# Patient Record
Sex: Female | Born: 1955
Health system: Southern US, Community
[De-identification: ages and names within clinical notes are randomized; demographics above are authoritative.]

## PROBLEM LIST (undated history)

## (undated) DIAGNOSIS — Z923 Personal history of irradiation: Secondary | ICD-10-CM

## (undated) DIAGNOSIS — M1990 Unspecified osteoarthritis, unspecified site: Secondary | ICD-10-CM

## (undated) DIAGNOSIS — K219 Gastro-esophageal reflux disease without esophagitis: Secondary | ICD-10-CM

## (undated) DIAGNOSIS — I10 Essential (primary) hypertension: Secondary | ICD-10-CM

## (undated) HISTORY — DX: Gastro-esophageal reflux disease without esophagitis: K21.9

## (undated) HISTORY — PX: JOINT REPLACEMENT: SHX530

## (undated) HISTORY — DX: Unspecified osteoarthritis, unspecified site: M19.90

## (undated) HISTORY — DX: Essential (primary) hypertension: I10

## (undated) HISTORY — PX: BREAST LUMPECTOMY: SHX2

---

## 1980-01-07 HISTORY — PX: KNEE SURGERY: SHX244

## 1983-01-07 HISTORY — PX: OTHER SURGICAL HISTORY: SHX169

## 1989-01-06 HISTORY — PX: SHOULDER SURGERY: SHX246

## 1998-07-30 ENCOUNTER — Other Ambulatory Visit: Admission: RE | Admit: 1998-07-30 | Discharge: 1998-07-30 | Payer: Self-pay | Admitting: Obstetrics and Gynecology

## 1999-12-23 ENCOUNTER — Other Ambulatory Visit: Admission: RE | Admit: 1999-12-23 | Discharge: 1999-12-23 | Payer: Self-pay | Admitting: *Deleted

## 1999-12-25 ENCOUNTER — Encounter: Payer: Self-pay | Admitting: Obstetrics and Gynecology

## 1999-12-25 ENCOUNTER — Encounter: Admission: RE | Admit: 1999-12-25 | Discharge: 1999-12-25 | Payer: Self-pay | Admitting: Obstetrics and Gynecology

## 2000-12-23 ENCOUNTER — Other Ambulatory Visit: Admission: RE | Admit: 2000-12-23 | Discharge: 2000-12-23 | Payer: Self-pay | Admitting: Obstetrics and Gynecology

## 2002-11-29 ENCOUNTER — Emergency Department (HOSPITAL_COMMUNITY): Admission: EM | Admit: 2002-11-29 | Discharge: 2002-11-29 | Payer: Self-pay | Admitting: Emergency Medicine

## 2002-12-16 ENCOUNTER — Other Ambulatory Visit: Admission: RE | Admit: 2002-12-16 | Discharge: 2002-12-16 | Payer: Self-pay | Admitting: Obstetrics and Gynecology

## 2002-12-21 ENCOUNTER — Ambulatory Visit (HOSPITAL_COMMUNITY): Admission: RE | Admit: 2002-12-21 | Discharge: 2002-12-21 | Payer: Self-pay | Admitting: Internal Medicine

## 2003-11-23 ENCOUNTER — Ambulatory Visit: Payer: Self-pay | Admitting: Internal Medicine

## 2004-07-17 ENCOUNTER — Ambulatory Visit: Payer: Self-pay | Admitting: Internal Medicine

## 2007-09-02 ENCOUNTER — Ambulatory Visit: Payer: Self-pay | Admitting: Family Medicine

## 2007-09-02 DIAGNOSIS — IMO0001 Reserved for inherently not codable concepts without codable children: Secondary | ICD-10-CM | POA: Insufficient documentation

## 2007-10-14 ENCOUNTER — Ambulatory Visit: Payer: Self-pay | Admitting: Internal Medicine

## 2007-10-14 DIAGNOSIS — R21 Rash and other nonspecific skin eruption: Secondary | ICD-10-CM | POA: Insufficient documentation

## 2008-03-29 ENCOUNTER — Emergency Department (HOSPITAL_COMMUNITY): Admission: EM | Admit: 2008-03-29 | Discharge: 2008-03-29 | Payer: Self-pay | Admitting: *Deleted

## 2008-07-20 ENCOUNTER — Encounter: Admission: RE | Admit: 2008-07-20 | Discharge: 2008-07-20 | Payer: Self-pay | Admitting: Obstetrics and Gynecology

## 2011-08-04 ENCOUNTER — Ambulatory Visit (INDEPENDENT_AMBULATORY_CARE_PROVIDER_SITE_OTHER): Payer: Managed Care, Other (non HMO) | Admitting: Internal Medicine

## 2011-08-04 ENCOUNTER — Encounter: Payer: Self-pay | Admitting: Internal Medicine

## 2011-08-04 VITALS — BP 136/72 | HR 90 | Temp 98.2°F | Ht 67.0 in | Wt 192.0 lb

## 2011-08-04 DIAGNOSIS — R197 Diarrhea, unspecified: Secondary | ICD-10-CM

## 2011-08-04 DIAGNOSIS — Z0000 Encounter for general adult medical examination without abnormal findings: Secondary | ICD-10-CM | POA: Insufficient documentation

## 2011-08-04 MED ORDER — CIPROFLOXACIN HCL 500 MG PO TABS: 500.0000 mg | ORAL_TABLET | Freq: Two times a day (BID) | ORAL | Status: DC

## 2011-08-04 NOTE — Assessment & Plan Note (Addendum)
Patient likely has infectious diarrhea esp considering blood tinged stools.  Start empiric cipro.  Increase fluids, continue imodium and follow BRAT diet.  Patient advised to call office if symptoms persist or worsen.  Check stool culture, lactoferrin and C. Diff

## 2011-08-04 NOTE — Progress Notes (Signed)
  Subjective:    Patient ID: Michelle Howell, female    DOB: 11-May-1955, 56 y.o.   MRN: [id number]  Diarrhea  This is a new problem. The current episode started in the past 7 days. The problem has been gradually worsening. The stool consistency is described as blood tinged. The patient states that diarrhea awakens her from sleep. Pertinent negatives include no abdominal pain, chills, fever or vomiting. She has tried anti-motility drug for the symptoms. The treatment provided no relief.   Patient denies any unusual food intake. She did have some beef ribs on Thursday night that caused indigestion. She describes having frequent bowel movements ( 2-3 per hour. Consistency is described as loose , watery , and blood-tinged.)   She denies any history of recent antibiotic use.   Review of Systems  Constitutional: Negative for fever and chills.  Gastrointestinal: Positive for diarrhea. Negative for vomiting and abdominal pain.    Past Medical History  Diagnosis Date  . GERD (gastroesophageal reflux disease)     History   Social History  . Marital Status: Single    Spouse Name: N/A    Number of Children: N/A  . Years of Education: N/A   Occupational History  . Not on file.   Social History Main Topics  . Smoking status: Never Smoker   . Smokeless tobacco: Not on file  . Alcohol Use: Yes  . Drug Use: No  . Sexually Active: Not on file   Other Topics Concern  . Not on file   Social History Narrative  . No narrative on file    History reviewed. No pertinent past surgical history.  Family History  Problem Relation Age of Onset  . Hypertension Mother   . Hyperlipidemia Mother   . Heart disease Mother   . Heart attack Mother   . Heart disease Father   . Hypertension Father   . Heart failure Father   . Heart attack Father   . Diabetes Brother   . Cancer Brother     kidney  . Heart disease Brother     Allergies  Allergen Reactions  . Meperidine Hcl   . Penicillins      Current Outpatient Prescriptions on File Prior to Visit  Medication Sig Dispense Refill  . omeprazole (PRILOSEC) 20 MG capsule Take 20 mg by mouth daily.        BP 136/72  Pulse 90  Temp 98.2 F (36.8 C) (Oral)  Ht 5\' 7"  (1.702 m)  Wt 192 lb (87.091 kg)  BMI 30.07 kg/m2       Objective:   Physical Exam  Constitutional: She is oriented to person, place, and time. She appears well-developed and well-nourished.  Cardiovascular: Normal rate, regular rhythm and normal heart sounds.   Pulmonary/Chest: Effort normal and breath sounds normal. She has no wheezes.  Abdominal: Soft. There is no rebound and no guarding.        Increase in bowel sounds  mild diffuse tenderness  Neurological: She is alert and oriented to person, place, and time.  Skin: Skin is warm and dry.  Psychiatric: She has a normal mood and affect. Her behavior is normal.       Assessment & Plan:

## 2011-08-04 NOTE — Patient Instructions (Addendum)
Increase fluid intake Use imodium over the counter 3 times daily as needed Follow BRAT diet (Bananas, plain rice, apple sauce, dry toast) for next 24-48 hrs.  You can also have clear broth. Please call our office if your symptoms do not improve or gets worse.

## 2011-08-04 NOTE — Assessment & Plan Note (Signed)
Refer for screening colonoscopy 

## 2011-08-06 ENCOUNTER — Telehealth: Payer: Self-pay | Admitting: Internal Medicine

## 2011-08-06 ENCOUNTER — Encounter: Payer: Self-pay | Admitting: Internal Medicine

## 2011-08-06 NOTE — Telephone Encounter (Signed)
Caller: Michelle Howell/Patient; PCP: Allena Earing); CB#: [phone]; ; ; Call regarding Question About Medicine; Having lot of diarrhea and going frequently, lot of rectal pressure, rectal bleeding and pain x3 days then seen in Office on Mon 08/04/2011.  Was given diet instructions and antibiotic/Cipro.    Antibioitic was filled yesterday am 7/30  but did not take due possible side effects listed of making tendinitis or arthritis worse.  Diarrhea stopped about OV time then has had 2 BMs today 7/31   both with red blood.   Has collected specimen for lab.  Has scheduled colonoscopy for end of Sept.  Still  uncomfortable stools, pressure continues, each stool is red with fresh blood.   Triaged in Gastrointestinal Bleeding Guideline - Disposition:  See Provider Within 4 Hours due to one or more episodes of rectal bleeding and no Sx of hypovolemia. Asking for change in antibiotic that will not cause problems with her tendinitis since plays tennis regularily. PLEASE CONTACT PATIENT AT  [phone].

## 2011-08-06 NOTE — Telephone Encounter (Signed)
Please call back pt today

## 2011-08-06 NOTE — Telephone Encounter (Signed)
Change abx to azithromycin 250 mg  -  Take 2 tabs once daily for 3 days. Pt still needs to be seen within 24 -48 hrs if diarrhea and rectal bleeding getting worse.   She can also try sitting in sitz bath (epsom salt) solution

## 2011-08-06 NOTE — Telephone Encounter (Signed)
Left message for pt to call back  °

## 2011-08-07 MED ORDER — AZITHROMYCIN 250 MG PO TABS: ORAL_TABLET | ORAL | Status: AC

## 2011-08-07 NOTE — Telephone Encounter (Signed)
I spoke with Michelle Howell and relayed Dr. Olegario Messier instructions. Michelle Howell will try sitz bath tonight. Coming in after work today to drop off stool samples. States she will start abx & do the bath. If she is worse tomorrow, she will call for appt with Dr. Artist Pais. [name] to call in abx to [facility] on [address]. Thanks!

## 2011-08-07 NOTE — Telephone Encounter (Signed)
rx sent electronically. 

## 2011-08-07 NOTE — Telephone Encounter (Signed)
Left message for pt to call back  °

## 2011-08-11 ENCOUNTER — Other Ambulatory Visit: Payer: Self-pay | Admitting: Internal Medicine

## 2011-08-11 LAB — STOOL CULTURE

## 2011-09-17 ENCOUNTER — Ambulatory Visit (AMBULATORY_SURGERY_CENTER): Payer: Managed Care, Other (non HMO) | Admitting: *Deleted

## 2011-09-17 VITALS — Ht 66.5 in | Wt 189.0 lb

## 2011-09-17 DIAGNOSIS — Z1211 Encounter for screening for malignant neoplasm of colon: Secondary | ICD-10-CM

## 2011-09-17 MED ORDER — NA SULFATE-K SULFATE-MG SULF 17.5-3.13-1.6 GM/177ML PO SOLN: ORAL | Status: DC

## 2011-09-18 ENCOUNTER — Encounter: Payer: Self-pay | Admitting: Internal Medicine

## 2011-10-03 ENCOUNTER — Encounter: Payer: Self-pay | Admitting: Internal Medicine

## 2011-10-03 ENCOUNTER — Ambulatory Visit (AMBULATORY_SURGERY_CENTER): Payer: Managed Care, Other (non HMO) | Admitting: Internal Medicine

## 2011-10-03 VITALS — BP 142/95 | HR 67 | Temp 97.9°F | Resp 16 | Ht 66.5 in | Wt 189.0 lb

## 2011-10-03 DIAGNOSIS — K635 Polyp of colon: Secondary | ICD-10-CM

## 2011-10-03 DIAGNOSIS — Z1211 Encounter for screening for malignant neoplasm of colon: Secondary | ICD-10-CM

## 2011-10-03 DIAGNOSIS — K621 Rectal polyp: Secondary | ICD-10-CM

## 2011-10-03 DIAGNOSIS — D126 Benign neoplasm of colon, unspecified: Secondary | ICD-10-CM

## 2011-10-03 DIAGNOSIS — K620 Anal polyp: Secondary | ICD-10-CM

## 2011-10-03 DIAGNOSIS — K6389 Other specified diseases of intestine: Secondary | ICD-10-CM

## 2011-10-03 MED ORDER — SODIUM CHLORIDE 0.9 % IV SOLN: 500.0000 mL | INTRAVENOUS | Status: DC

## 2011-10-03 NOTE — Progress Notes (Signed)
Patient did not experience any of the following events: a burn prior to discharge; a fall within the facility; wrong site/side/patient/procedure/implant event; or a hospital transfer or hospital admission upon discharge from the facility. (G8907) Patient did not have preoperative order for IV antibiotic SSI prophylaxis. (G8918)  

## 2011-10-03 NOTE — Patient Instructions (Addendum)

## 2011-10-03 NOTE — Op Note (Signed)
[facility] [address] [place], [address]   COLONOSCOPY PROCEDURE REPORT  PATIENT: Michelle Howell, Michelle Howell  MR#: [id number] BIRTHDATE: Jun 06, 1955 , 56  yrs. old GENDER: Female ENDOSCOPIST: Beverley Fiedler, MD REFERRED MW:[id number], Bruce PROCEDURE DATE:  10/03/2011 PROCEDURE:   Colonoscopy with cold biopsy polypectomy ASA CLASS:   Class II INDICATIONS:average risk screening and first colonoscopy. MEDICATIONS: MAC sedation, administered by CRNA and propofol (Diprivan) 300mg  IV  DESCRIPTION OF PROCEDURE:   After the risks benefits and alternatives of the procedure were thoroughly explained, informed consent was obtained.  A digital rectal exam revealed several skin tags.   The LB [id number]-[id number]  endoscope was introduced through the anus and advanced to the terminal ileum which was intubated for a short distance. No adverse events experienced.   The quality of the prep was Suprep good  The instrument was then slowly withdrawn as the colon was fully examined.   COLON FINDINGS: The mucosa appeared normal in the terminal ileum. Mild melanosis was found throughout the entire examined colon.   A sessile polyp measuring 3 mm in size was found at the hepatic flexure.  A polypectomy was performed with cold forceps.  The resection was complete and the polyp tissue was completely retrieved.   Two sessile polyps ranging between 3-37mm in size were found in the rectum.  Polypectomy was performed with cold forceps. All resections were complete and all polyp tissue was completely retrieved.   Moderate diverticulosis was noted in the descending colon and sigmoid colon.   Small internal hemorrhoids were found. Retroflexed views revealed internal hemorrhoids and skin tags. The time to cecum=3 minutes 47 seconds.  Withdrawal time=13 minutes 41 seconds.  The scope was withdrawn and the procedure completed.  COMPLICATIONS: There were no complications.  ENDOSCOPIC IMPRESSION: 1.    Normal mucosa in the terminal ileum 2.   Mild melanosis was found throughout the entire examined colon 3.   Sessile polyp measuring 3 mm in size was found at the hepatic flexure; polypectomy was performed with cold forceps 4.   Two sessile polyps ranging between 3-75mm in size were found in the rectum; Polypectomy was performed with cold forceps 5.   Moderate diverticulosis was noted in the descending colon and sigmoid colon 6.   Small internal hemorrhoids with skin tags.       RECOMMENDATIONS: 1.  Await pathology results 2.  High fiber diet 3.  If the polyps removed today are proven to be adenomatous (pre-cancerous) polyps, you will need a colonoscopy in 3-5 years and this decision will be based on pathology results.  Otherwise you should continue to follow colorectal cancer screening guidelines for "routine risk" patients with a colonoscopy in 10 years.  You will receive a letter within 1-2 weeks with the results of your biopsy as well as final recommendations.  Please call my office if you have not received a letter after 3 weeks.   eSigned:  Beverley Fiedler, MD 10/03/2011 11:52 AM   cc: Lindley Magnus, MD and The Patient   PATIENT NAME:  Michelle Howell, Michelle Howell MR#: [id number]

## 2011-10-06 ENCOUNTER — Telehealth: Payer: Self-pay

## 2011-10-06 NOTE — Telephone Encounter (Signed)
Left message

## 2011-10-07 ENCOUNTER — Encounter: Payer: Self-pay | Admitting: Internal Medicine

## 2013-02-09 ENCOUNTER — Ambulatory Visit (INDEPENDENT_AMBULATORY_CARE_PROVIDER_SITE_OTHER): Payer: BC Managed Care – PPO | Admitting: Family Medicine

## 2013-02-09 ENCOUNTER — Encounter: Payer: Self-pay | Admitting: Family Medicine

## 2013-02-09 ENCOUNTER — Encounter: Payer: Self-pay | Admitting: *Deleted

## 2013-02-09 VITALS — BP 150/98 | HR 100 | Temp 99.4°F | Wt 198.0 lb

## 2013-02-09 DIAGNOSIS — J329 Chronic sinusitis, unspecified: Secondary | ICD-10-CM

## 2013-02-09 MED ORDER — HYDROCOD POLST-CHLORPHEN POLST 10-8 MG/5ML PO LQCR: 5.0000 mL | Freq: Every evening | ORAL | Status: DC | PRN

## 2013-02-09 MED ORDER — AZITHROMYCIN 250 MG PO TABS: ORAL_TABLET | ORAL | Status: DC

## 2013-02-09 NOTE — Progress Notes (Signed)
Pre visit review using our clinic review tool, if applicable. No additional management support is needed unless otherwise documented below in the visit note. 

## 2013-02-09 NOTE — Progress Notes (Signed)
Chief Complaint  Patient presents with  . Cough  . head congestion    HPI:  -started: 4 days ago -symptoms:nasal congestion, sore throat, cough, sinus pain maxillary, low grade temp -denies:fever, SOB, NVD -has tried: otc medications -sick contacts/travel/risks: denies flu exposure or Ebola risks  ROS: See pertinent positives and negatives per HPI.  Past Medical History  Diagnosis Date  . GERD (gastroesophageal reflux disease)     Past Surgical History  Procedure Laterality Date  . Shoulder surgery  1991    right  . Knee surgery  1982    left  . Dental implants  1985    Family History  Problem Relation Age of Onset  . Hypertension Mother   . Hyperlipidemia Mother   . Heart disease Mother   . Heart attack Mother   . Heart disease Father   . Hypertension Father   . Heart failure Father   . Heart attack Father   . Diabetes Brother   . Cancer Brother     kidney  . Heart disease Brother   . Colon cancer Neg Hx   . Stomach cancer Neg Hx     History   Social History  . Marital Status: Single    Spouse Name: N/A    Number of Children: N/A  . Years of Education: N/A   Social History Main Topics  . Smoking status: Never Smoker   . Smokeless tobacco: Never Used  . Alcohol Use: 8.4 oz/week    14 Cans of beer per week  . Drug Use: No  . Sexual Activity: None   Other Topics Concern  . None   Social History Narrative  . None    Current outpatient prescriptions:Cholecalciferol (VITAMIN D) 2000 UNITS tablet, Take 2,000 Units by mouth daily., Disp: , Rfl: ;  Coenzyme Q10 (CO Q 10 PO), Take by mouth daily., Disp: , Rfl: ;  Flaxseed, Linseed, (FLAXSEED OIL PO), Take by mouth daily., Disp: , Rfl: ;  ibuprofen (ADVIL,MOTRIN) 200 MG tablet, Take 200 mg by mouth as needed., Disp: , Rfl: ;  Omega 3-6-9 Fatty Acids (OMEGA-3-6-9 PO), Take by mouth daily., Disp: , Rfl:  omeprazole (PRILOSEC) 20 MG capsule, Take 20 mg by mouth daily., Disp: , Rfl: ;  PINE BARK, PYCNOGENOL,  PO, Take by mouth daily., Disp: , Rfl: ;  Red Yeast Rice Extract (RED YEAST RICE PO), Take by mouth daily., Disp: , Rfl: ;  TURMERIC PO, Take by mouth daily., Disp: , Rfl: ;  UNABLE TO FIND, Med Name: Mangosistine.  Takes 2 tablets daily, Disp: , Rfl:  azithromycin (ZITHROMAX) 250 MG tablet, 2 tabs on first day then one tab daily, Disp: 6 tablet, Rfl: 0;  chlorpheniramine-HYDROcodone (TUSSIONEX PENNKINETIC ER) 10-8 MG/5ML LQCR, Take 5 mLs by mouth at bedtime as needed for cough., Disp: 115 mL, Rfl: 0  EXAM:  Filed Vitals:   02/09/13 1337  BP: 150/98  Pulse: 100  Temp: 99.4 F (37.4 C)    Body mass index is 31.48 kg/(m^2).  GENERAL: vitals reviewed and listed above, alert, oriented, appears well hydrated and in no acute distress  HEENT: atraumatic, conjunttiva clear, no obvious abnormalities on inspection of external nose and ears, normal appearance of ear canals and TMs, clear nasal congestion, mild post oropharyngeal erythema with PND, no tonsillar edema or exudate, no sinus TTP  NECK: no obvious masses on inspection  LUNGS: clear to auscultation bilaterally, no wheezes, rales or rhonchi, good air movement  CV: HRRR, no peripheral edema  MS: moves all extremities without noticeable abnormality  PSYCH: pleasant and cooperative, no obvious depression or anxiety  ASSESSMENT AND PLAN:  Discussed the following assessment and plan:  Sinusitis - Plan: chlorpheniramine-HYDROcodone (TUSSIONEX PENNKINETIC ER) 10-8 MG/5ML LQCR, azithromycin (ZITHROMAX) 250 MG tablet  -discussed risks of meds - potential for viral versus bacterial sinusitis and option to hold off on abx for several days to see if improves -follow up if worsneing or not improving    Patient Instructions  INSTRUCTIONS FOR UPPER RESPIRATORY INFECTION:  -plenty of rest and fluids  -nasal saline wash 2-3 times daily (use prepackaged nasal saline or bottled/distilled water if making your own)   -can use sinex or afrin  nasal spray for drainage and nasal congestion - but do NOT use longer then 3-4 days  -can use tylenol or ibuprofen as directed for aches and sorethroat  -in the winter time, using a humidifier at night is helpful (please follow cleaning instructions)  -if you are taking a cough medication - use only as directed, may also try a teaspoon of honey to coat the throat and throat lozenges  -for sore throat, salt water gargles can help  -follow up if you have fevers, facial pain, tooth pain, difficulty breathing or are worsening or not getting better in 5-7 days      KIM, HANNAH R.

## 2013-02-09 NOTE — Patient Instructions (Signed)

## 2013-04-20 ENCOUNTER — Telehealth: Payer: Self-pay | Admitting: Internal Medicine

## 2013-04-20 NOTE — Telephone Encounter (Signed)
Left message on pts cell phone to go to urgent care.  Dr Leanne Chang out of the office

## 2013-04-20 NOTE — Telephone Encounter (Signed)
Pt requesting a rx for pink eye. Advised pt she needed appt. Pt has refused appt and is going out of town in the am. Pt states does not have time to come in. Ok to leave message.  [facility]/ [place]

## 2013-08-05 ENCOUNTER — Ambulatory Visit (INDEPENDENT_AMBULATORY_CARE_PROVIDER_SITE_OTHER): Payer: BC Managed Care – PPO | Admitting: Family Medicine

## 2013-08-05 ENCOUNTER — Encounter: Payer: Self-pay | Admitting: Family Medicine

## 2013-08-05 VITALS — BP 126/84 | HR 89 | Temp 98.2°F | Ht 67.0 in | Wt 180.5 lb

## 2013-08-05 DIAGNOSIS — M542 Cervicalgia: Secondary | ICD-10-CM

## 2013-08-05 DIAGNOSIS — J020 Streptococcal pharyngitis: Secondary | ICD-10-CM

## 2013-08-05 LAB — CBC WITH DIFFERENTIAL/PLATELET
Basophils Absolute: 0 10*3/uL (ref 0.0–0.1)
Basophils Relative: 0.5 % (ref 0.0–3.0)
EOS PCT: 6.2 % — AB (ref 0.0–5.0)
Eosinophils Absolute: 0.3 10*3/uL (ref 0.0–0.7)
HEMATOCRIT: 42 % (ref 36.0–46.0)
Hemoglobin: 14 g/dL (ref 12.0–15.0)
LYMPHS PCT: 36 % (ref 12.0–46.0)
Lymphs Abs: 1.9 10*3/uL (ref 0.7–4.0)
MCHC: 33.5 g/dL (ref 30.0–36.0)
MCV: 93.6 fl (ref 78.0–100.0)
MONOS PCT: 5.8 % (ref 3.0–12.0)
Monocytes Absolute: 0.3 10*3/uL (ref 0.1–1.0)
Neutro Abs: 2.8 10*3/uL (ref 1.4–7.7)
Neutrophils Relative %: 51.5 % (ref 43.0–77.0)
PLATELETS: 292 10*3/uL (ref 150.0–400.0)
RBC: 4.48 Mil/uL (ref 3.87–5.11)
RDW: 12.4 % (ref 11.5–15.5)
WBC: 5.4 10*3/uL (ref 4.0–10.5)

## 2013-08-05 LAB — POCT RAPID STREP A (OFFICE): RAPID STREP A SCREEN: NEGATIVE

## 2013-08-05 LAB — TSH: TSH: 0.58 u[IU]/mL (ref 0.35–4.50)

## 2013-08-05 NOTE — Patient Instructions (Addendum)
Needs to complete record release form for OBGYN, request recent labs.    Throat pain  Unclear etiology, suspect most likely a soft tissue injury or muscle strain  Check CBC and thyroid today  If any symptoms that we discussed not limited to headache, trouble swallowing, weakness, blurry vision, hearing difficulty, worsening pain were to occur you should seek care immediately   We discussed there was a risk of a carotid injury though we thought this was less likely.   Follow up in 1 week. Would consider imaging or ENT referral.

## 2013-08-05 NOTE — Progress Notes (Signed)
Garret Reddish, MD Phone: [phone]  Subjective:   Michelle Howell is a 58 y.o. 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 8/10 tension pain like something wanted to explode or like she was hit. Felt like an intense charlie horse lasting about 1-2 minutes. She noted some swelling and the pain and swelling and tenderness persisted over night at a lower level. Next morning pain continued at low level. Almost in a line from her ear down most of sternocleidomastoid with current pain 4/10 with pushing area and 1-2 with not touching it. The pain seems to be focused about midway down her SCM muscle and is worse with palpation.  Took ibuprofen last night and helped some. Was able to play 4 hours of tennis yesterday without difficulty.   ROS- no fever/chills. No dysphagia but more aware of food when swallowing. No slurred speech. No headaches or blurry vision. No weakness. No lightheadedness. Not worse with turning head. No history neck injury. No neck stiffness.   Past Medical History- previously healthy, nonsmoker, no diabetes history, may have elevated cholesterol Family history- no history of dissections of aorta or carotid artery.  Social history- nonsmoker  Medications- reviewed and updated Current Outpatient Prescriptions  Medication Sig Dispense Refill  . Cholecalciferol (VITAMIN D) 2000 UNITS tablet Take 2,000 Units by mouth daily.      . Coenzyme Q10 (CO Q 10 PO) Take by mouth daily.      . Flaxseed, Linseed, (FLAXSEED OIL PO) Take by mouth daily.      Marland Kitchen ibuprofen (ADVIL,MOTRIN) 200 MG tablet Take 200 mg by mouth as needed.      Ernestine Conrad 3-6-9 Fatty Acids (OMEGA-3-6-9 PO) Take by mouth daily.      Marland Kitchen omeprazole (PRILOSEC) 20 MG capsule Take 20 mg by mouth daily.      Marland Kitchen PINE BARK, PYCNOGENOL, PO Take by mouth daily.        . TURMERIC PO Take by mouth daily.      Marland Kitchen UNABLE TO FIND Med Name: Mangosistine.  Takes 2 tablets daily      . Red Yeast Rice Extract (RED YEAST RICE PO) Take by mouth daily.       No current facility-administered medications for this visit.    Objective: BP 126/84  Pulse 89  Temp(Src) 98.2 F (36.8 C)  Ht 5\' 7"  (1.702 m)  Wt 180 lb 8 oz (81.874 kg)  BMI 28.26 kg/m2  SpO2 97% Gen: NAD, resting comfortably on chair HEENT: turbinates normal, oropharynx normal without pharyngeal exudate, TM normal bilaterally, Mucous membranes are moist. Neck: no thyromegaly, about 1/2 way down sternocleidomastoid. Carotid artery palpable at this point without clear abnormality. No bruits detected.  Head:  NCAT. PERRLA. No ptosis. CV: RRR no mrg  Lungs: CTAB  Skin: warm and dry, no rash  Neuro: CN II-XII intact, sensation and reflexes normal throughout, 5/5 muscle strength in bilateral upper and lower extremities. Normal finger to nose. Normal rapid alternating movements. Normal gait. No pronator drift. Normal romberg.   Results for orders placed in visit on 08/05/13 (from the past 24 hour(s))  POCT RAPID STREP A (OFFICE)     Status: None   Collection Time    08/05/13  2:12 PM      Result Value Ref Range   Rapid Strep A Screen Negative  Negative  CBC WITH DIFFERENTIAL     Status: Abnormal   Collection Time    08/05/13  2:48 PM      Result Value Ref Range   WBC 5.4  4.0 - 10.5 K/uL   RBC 4.48  3.87 - 5.11 Mil/uL   Hemoglobin 14.0  12.0 - 15.0 g/dL   HCT 42.0  36.0 - 46.0 %   MCV 93.6  78.0 - 100.0 fl   MCHC 33.5  30.0 - 36.0 g/dL   RDW 12.4  11.5 - 15.5 %   Platelets 292.0  150.0 - 400.0 K/uL   Neutrophils Relative % 51.5  43.0 - 77.0 %   Lymphocytes Relative 36.0  12.0 - 46.0 %   Monocytes Relative 5.8  3.0 - 12.0 %   Eosinophils Relative 6.2 (*) 0.0 - 5.0 %   Basophils Relative 0.5  0.0 - 3.0 %   Neutro Abs 2.8  1.4 - 7.7 K/uL   Lymphs Abs 1.9  0.7 - 4.0 K/uL   Monocytes Absolute 0.3   0.1 - 1.0 K/uL   Eosinophils Absolute 0.3  0.0 - 0.7 K/uL   Basophils Absolute 0.0  0.0 - 0.1 K/uL  TSH     Status: None   Collection Time    08/05/13  2:48 PM      Result Value Ref Range   TSH 0.58  0.35 - 4.50 uIU/mL    Assessment/Plan:  Neck Pain Suspect soft tissue injury with unclear mechanism. Possible muscle strain though not likely of sternocleidomastoid which is nontender. We checked a TSH and CBC with diff today which were unremarkable. Normal neurological exam and no other symptoms to suggest carotid dissection such as typical features of horner's syndrome. Given patient played 4 hours tennis yesterday without any symptoms other than mild discomfort in neck, doubt there is a carotid artery dissection. Patient with no personal or family history of vessel issues and she has minimal risk factors (possible high cholesterol-ob/gyn faxing [place] records) We discussed this is a possibility and gave warning signs. Patient will follow up next week (advised early next week). We will consider referral to ENT vs. Imaging to rule out carotid pathology. Imaging considerations for future. [facility] recommends axial MRI of the neck with T1-weighted, fat-suppressed sequences and magnetic resonance angiography (MRA) .

## 2013-08-05 NOTE — Progress Notes (Signed)
Pre visit review using our clinic review tool, if applicable. No additional management support is needed unless otherwise documented below in the visit note. 

## 2013-08-12 ENCOUNTER — Ambulatory Visit (INDEPENDENT_AMBULATORY_CARE_PROVIDER_SITE_OTHER): Payer: BC Managed Care – PPO | Admitting: Family Medicine

## 2013-08-12 ENCOUNTER — Encounter: Payer: Self-pay | Admitting: Family Medicine

## 2013-08-12 VITALS — BP 140/98 | HR 84 | Temp 98.0°F | Wt 192.0 lb

## 2013-08-12 DIAGNOSIS — M19072 Primary osteoarthritis, left ankle and foot: Secondary | ICD-10-CM

## 2013-08-12 DIAGNOSIS — M542 Cervicalgia: Secondary | ICD-10-CM

## 2013-08-12 DIAGNOSIS — M19079 Primary osteoarthritis, unspecified ankle and foot: Secondary | ICD-10-CM

## 2013-08-12 DIAGNOSIS — E785 Hyperlipidemia, unspecified: Secondary | ICD-10-CM

## 2013-08-12 MED ORDER — DICLOFENAC SODIUM 1 % TD GEL: 2.0000 g | Freq: Four times a day (QID) | TRANSDERMAL | Status: DC | PRN

## 2013-08-12 MED ORDER — MELOXICAM 15 MG PO TABS: 15.0000 mg | ORAL_TABLET | Freq: Every day | ORAL | Status: DC | PRN

## 2013-08-12 NOTE — Progress Notes (Signed)
Garret Reddish, MD Phone: [phone]  Subjective:   Michelle Howell is a 58 y.o. year old very pleasant female patient who presents with the following:  Severe Neck Pain follow up Resolved over Saturday night except for with palpation. By yesterday, pain with palpation had resolved.  ROS- No dysphagia. Awareness of swallowing has resolved. No slurred speech. No headaches or blurry vision.  No lightheadedness. Not produced with turning head.  No neck stiffness.   Hyperlipidemia/hypertriglyceridemia Patient was told had high cholesterol at ob/gyn. We called obgyn as had not yet received records from last week request. Total cholesterol 266, trig 243, LDL 161, HDL 57.  On statin: no Regular exercise: yes, active and plays tennis Diet: admits she could lose some weight and eat better ROS- no chest pain or shortness of breath. No myalgias  Left ankle arthritis From phone report, kidney function normal (from obgyn). Would like a medication that lasts longer than 4-6 hours. Has a foot deformity since birth that places stress on ankle joint especially lateral portion with severe hindfoot valgus. Plays a lot of tennis. Would like to have a topical option as well.  ROS-no redness or fever/chills   Past Medical History- nonsmoker  Medications- reviewed and updated Current Outpatient Prescriptions  Medication Sig Dispense Refill  . Cholecalciferol (VITAMIN D) 2000 UNITS tablet Take 2,000 Units by mouth daily.      . Coenzyme Q10 (CO Q 10 PO) Take by mouth daily.      . Flaxseed, Linseed, (FLAXSEED OIL PO) Take by mouth daily.      Ernestine Conrad 3-6-9 Fatty Acids (OMEGA-3-6-9 PO) Take by mouth daily.      Marland Kitchen omeprazole (PRILOSEC) 20 MG capsule Take 20 mg by mouth daily.      Marland Kitchen PINE BARK, PYCNOGENOL, PO Take by mouth daily.      . Red Yeast Rice Extract (RED YEAST RICE PO) Take by mouth daily.      . TURMERIC PO Take by mouth daily.      Marland Kitchen UNABLE TO FIND Med Name: Mangosistine.  Takes 2 tablets  daily      . diclofenac sodium (VOLTAREN) 1 % GEL Apply 2 g topically 4 (four) times daily as needed.  100 g  3  . meloxicam (MOBIC) 15 MG tablet Take 1 tablet (15 mg total) by mouth daily as needed for pain (may take 1/2).  30 tablet  3   No current facility-administered medications for this visit.    Objective: BP 140/98  Pulse 84  Temp(Src) 98 F (36.7 C)  Wt 192 lb (87.091 kg) Gen: NAD, resting comfortably Neck: no bruits, no palpable pain CV: RRR no murmurs rubs or gallops Lungs: CTAB no crackles, wheeze, rhonchi Left foot: severe hindfoot valgus. Hypertrophy of lateral malleolus. Left foot small compared to right.   Assessment/Plan:  Hyperlipidemia Poorly controlled LDL (161)and triglycerides (243). 10 year cardiac risk at 3% based off ascvd risk calculator. Triglycerides >200 though so recommended statin therapy. Patient would prefer to focus on diet/exercise and follow up in 4-6 months for repeat check.   Arthritis of left ankle Strongly suggested patient meet with Dr. Andreas Blower or Dr. Charlann Boxer given ankle deformity, leg length discrepancy and potential benefit of orthotics. She would like to hold off. She will trial Mobic prn as well as voltaren prn instead of ibuprofen. Will need to discuss repeat labs such as creatinine as may be using nsaids regularly.   Neck pain Concern for carotid dissection at  last visit. Fortunately, pain has completely resolved. Patient to follow up if recurrence.     Meds ordered this encounter  Medications  . meloxicam (MOBIC) 15 MG tablet    Sig: Take 1 tablet (15 mg total) by mouth daily as needed for pain (may take 1/2).    Dispense:  30 tablet    Refill:  3  . diclofenac sodium (VOLTAREN) 1 % GEL    Sig: Apply 2 g topically 4 (four) times daily as needed.    Dispense:  100 g    Refill:  3

## 2013-08-12 NOTE — Patient Instructions (Signed)
Neck pain-glad this resolved  Arthritis in Left ankle-mobic prn or voltaren gell prn  Lipids-see me back in 4-6 months. Work on regular exercise, likely target 20 lb weight loss, cut down on alcohol. Goal triglycerides <200.

## 2013-08-12 NOTE — Assessment & Plan Note (Signed)
Strongly suggested patient meet with Dr. Andreas Blower or Dr. Charlann Boxer given ankle deformity, leg length discrepancy and potential benefit of orthotics. She would like to hold off. She will trial Mobic prn as well as voltaren prn instead of ibuprofen. Will need to discuss repeat labs such as creatinine as may be using nsaids regularly.

## 2013-08-12 NOTE — Assessment & Plan Note (Signed)
Poorly controlled LDL (161)and triglycerides (243). 10 year cardiac risk at 3% based off ascvd risk calculator. Triglycerides >200 though so recommended statin therapy. Patient would prefer to focus on diet/exercise and follow up in 4-6 months for repeat check.

## 2013-08-19 ENCOUNTER — Telehealth: Payer: Self-pay | Admitting: Internal Medicine

## 2013-08-19 NOTE — Telephone Encounter (Signed)
Dr. Hunter please see below 

## 2013-08-19 NOTE — Telephone Encounter (Signed)
The PA for Voltaren was denied.  The pt must have a one week trial of an oral NSAID and deem ineffective or unable to tolerate or it is detrimental to [employer] health.

## 2013-08-19 NOTE — Telephone Encounter (Signed)
Inform patient that it was denied. She can use the NSAID we provided for by mouth use.

## 2014-04-20 ENCOUNTER — Encounter: Payer: Self-pay | Admitting: Family Medicine

## 2014-04-20 ENCOUNTER — Ambulatory Visit (INDEPENDENT_AMBULATORY_CARE_PROVIDER_SITE_OTHER): Payer: BLUE CROSS/BLUE SHIELD | Admitting: Family Medicine

## 2014-04-20 VITALS — BP 160/90 | HR 67 | Temp 97.7°F | Wt 196.0 lb

## 2014-04-20 DIAGNOSIS — M129 Arthropathy, unspecified: Secondary | ICD-10-CM | POA: Diagnosis not present

## 2014-04-20 DIAGNOSIS — T148 Other injury of unspecified body region: Secondary | ICD-10-CM | POA: Diagnosis not present

## 2014-04-20 DIAGNOSIS — W57XXXA Bitten or stung by nonvenomous insect and other nonvenomous arthropods, initial encounter: Secondary | ICD-10-CM | POA: Diagnosis not present

## 2014-04-20 DIAGNOSIS — M19072 Primary osteoarthritis, left ankle and foot: Secondary | ICD-10-CM

## 2014-04-20 NOTE — Assessment & Plan Note (Signed)
2 week trial of mobic not effective. We will trial prior authorization for voltaren gel. I would much prefer topical to systemic if this can be approved.

## 2014-04-20 NOTE — Patient Instructions (Signed)
This looks like a localized reaction to tick bite. Perhaps a slight cellulitis without obvious abscess. If you have expanding redness, fever, worsening pain, new rash you need to be seen. I strongly doubt RMSF or lyme but here is some information on them and see me if any of these symptoms develop.   [place] Spotted Fever [place] Spotted Fever (RMSF) is the oldest known tick-borne disease of people in the [place]. This disease was named because it was first described among people in the [facility] area who had an illness characterized by a rash with red-purple-black spots. This disease is caused by a rickettsia (Rickettsia rickettsii), a bacteria carried by the tick. [facility] wood tick and the American dog tick acquire and transmit the RMSF bacteria (pictures NOT actual size). When a larval, nymphal, or adult tick feeds on an infected rodent or larger animal, the tick can become infected. Infected adult ticks then feed on people who may then get RMSF. The tick transmits the disease to humans during a prolonged period of feeding that lasts many hours, days, or even a couple weeks. The bite is painless and frequently goes unnoticed. An infected female tick may also pass the rickettsial bacteria to her eggs that then may mature to be infected adult ticks. The rickettsia that causes RMSF can also get into a person's body through damaged skin. A tick bite is not necessary. People can get RMSF if they crush a tick and get its blood or body fluids on their skin through a small cut or sore.  DIAGNOSIS Diagnosis is made by laboratory tests.  TREATMENT Treatment is with antibiotics (medications that kill rickettsia and other bacteria). Immediate treatment usually prevents death. GEOGRAPHIC RANGE This disease was reported only in the [facility] until 1931. RMSF has more recently been described among individuals in all states except [place], [place], and [place]. The highest  reported incidences of RMSF now occur among residents of [place], [place], [place], and the [place]. TIME OF YEAR  Most cases are diagnosed during late spring and summer when ticks are most active. However, especially in the warmer [place] states, a few cases occur during the winter. SYMPTOMS   Symptoms of RMSF begin from 2 to 14 days after a tick bite. The most common early symptoms are fever, muscle aches, and headache followed by nausea (feeling sick to your stomach) or vomiting.  The RMSF rash is typically delayed until 3 or more days after symptom onset, and eventually develops in 9 of 10 infected patients by the fifth day of illness. If the disease is not treated it can cause death. If you get a fever, headache, muscle aches, rash, nausea, or vomiting within 2 weeks of a possible tick bite or exposure, you should see your caregiver immediately. PREVENTION Ticks prefer to hide in shady, moist ground litter. They can often be found above the ground clinging to tall grass, brush, shrubs and low tree branches. They also inhabit lawns and gardens, especially at the edges of woodlands and around old stone walls. Within the areas where ticks generally live, no naturally vegetated area can be considered completely free of infected ticks. The best precaution against RMSF is to avoid contact with soil, leaf litter, and vegetation as much as possible in tick-infested areas. For those who enjoy gardening or walking in their yards, clear brush and mow tall grass around houses and at the edges of gardens. This may help reduce the tick population in the immediate area.  Applications of chemical insecticides by a licensed professional in the spring (late May) and fall (September) will also control ticks, especially in heavily infested areas. Treatment will never get rid of all the ticks. Getting rid of small animal populations that host ticks will also decrease the tick population. When working in the garden,  [facility], or handling soil and vegetation, wear light-colored protective clothing and gloves. Spot-check often to prevent ticks from reaching the skin. Ticks cannot jump or fly. They will not drop from an above-ground perch onto a passing animal. Once a tick gains access to human skin it climbs upward until it reaches a more protected area. For example, the back of the knee, groin, navel, armpit, ears, or nape of the neck. It then begins the slow process of embedding itself in the skin. Campers, hikers, field workers, and others who spend time in wooded, brushy, or tall grassy areas can avoid exposure to ticks by using the following precautions:  Wear light-colored clothing with a tight weave to spot ticks more easily and prevent contact with the skin.  Wear long pants tucked into socks, long-sleeved shirts tucked into pants and enclosed shoes or boots along with insect repellent.  Spray clothes with insect repellent containing either DEET or Permethrin. Only DEET can be used on exposed skin. Follow the manufacturer's directions carefully.  Wear a hat and keep long hair pulled back.  Stay on cleared, well-worn trails whenever possible.  Spot-check yourself and others often for the presence of ticks on clothes. If you find one, there are likely to be others. Check thoroughly.  Remove clothes after leaving tick-infested areas. If possible, wash them to eliminate any unseen ticks. Check yourself, your children and any pets from head to toe for the presence of ticks.  Shower and shampoo. You can greatly reduce your chances of contracting RMSF if you remove attached ticks as soon as possible. Regular checks of the body, including all body sites covered by hair (head, armpits, genitals), allow removal of the tick before rickettsial transmission. To remove an attached tick, use a forceps or tweezers to detach the intact tick without leaving mouth parts in the skin. The tick bite wound should be  cleansed after tick removal. Remember the most common symptoms of RMSF are fever, muscle aches, headache, and nausea or vomiting with a later onset of rash. If you get these symptoms after a tick bite and while living in an area where RMSF is found, RMSF should be suspected. If the disease is not treated, it can cause death. See your caregiver immediately if you get these symptoms. Do this even if not aware of a tick bite. Document Released: 04/06/2000 Document Revised: 05/09/2013 Document Reviewed: 11/27/2008 [facility] Patient Information 2015 [place], [place]. This information is not intended to replace advice given to you by your health care provider. Make sure you discuss any questions you have with your health care provider.  Lyme Disease You may have been bitten by a tick and are to watch for the development of Lyme Disease. Lyme Disease is an infection that is caused by a bacteria The bacteria causing this disease is named Borreilia burgdorferi. If a tick is infected with this bacteria and then bites you, then Lyme Disease may occur. These ticks are carried by deer and rodents such as rabbits and mice and infest grassy as well as forested areas. Fortunately most tick bites do not cause Lyme Disease.  Lyme Disease is easier to prevent than to treat. First,  covering your legs with clothing when walking in areas where ticks are possibly abundant will prevent their attachment because ticks tend to stay within inches of the ground. Second, using insecticides containing DEET can be applied on skin or clothing. Last, because it takes about 12 to 24 hours for the tick to transmit the disease after attachment to the human host, you should inspect your body for ticks twice a day when you are in areas where Lyme Disease is common. You must look thoroughly when searching for ticks. The Ixodes tick that carries Lyme Disease is very small. It is around the size of a sesame seed (picture of tick is not actual size).  Removal is best done by grasping the tick by the head and pulling it out. Do not to squeeze the body of the tick. This could inject the infecting bacteria into the bite site. Wash the area of the bite with an antiseptic solution after removal.  Lyme Disease is a disease that may affect many body systems. Because of the small size of the biting tick, most people do not notice being bitten. The first sign of an infection is usually a round red rash that extends out from the center of the tick bite. The center of the lesion may be blood colored (hemorrhagic) or have tiny blisters (vesicular). Most lesions have bright red outer borders and partial central clearing. This rash may extend out many inches in diameter, and multiple lesions may be present. Other symptoms such as fatigue, headaches, chills and fever, general achiness and swelling of lymph glands may also occur. If this first stage of the disease is left untreated, these symptoms may gradually resolve by themselves, or progressive symptoms may occur because of spread of infection to other areas of the body.  Follow up with your caregiver to have testing and treatment if you have a tick bite and you develop any of the above complaints. Your caregiver may recommend preventative (prophylactic) medications which kill bacteria (antibiotics). Once a diagnosis of Lyme Disease is made, antibiotic treatment is highly likely to cure the disease. Effective treatment of late stage Lyme Disease may require longer courses of antibiotic therapy.  MAKE SURE YOU:   Understand these instructions.  Will watch your condition.  Will get help right away if you are not doing well or get worse. Document Released: 03/31/2000 Document Revised: 03/17/2011 Document Reviewed: 06/02/2008 [facility] Patient Information 2015 [place], [place]. This information is not intended to replace advice given to you by your health care provider. Make sure you discuss any questions you have with  your health care provider.

## 2014-04-20 NOTE — Progress Notes (Signed)
  Garret Reddish, MD Phone: [phone]  Subjective:   Michelle Howell is a 59 y.o. year old very pleasant female patient who presents with the following:  Tick Bite -working in yard Monday and next AM noted a tick on R buttocks. Removed with fingers. Flushed down toilet. Angriest spot she has ever had-history of many tick bites in past. No target lesion. Has a large red bump without purulent material. Mild aching pain when she sits on it.   Arthritis Left ankle Mobic 15mg  trial for 2 weeks with no improvement. She is taking large doses of ibuprofen and lasts 4-6 hours so she requires frequent dosing which relieves pain but not the best long term solution.   ROS- slightly feverish and some nausea. No objective fevers, vomiting.  No worsening ankle pain  Past Medical History- Patient Active Problem List   Diagnosis Date Noted  . Hyperlipidemia 08/12/2013    Priority: Medium  . Arthritis of left ankle 08/12/2013    Priority: Low  . Preventative health care 08/04/2011    Priority: Low   Medications- reviewed and updated Current Outpatient Prescriptions  Medication Sig Dispense Refill  . Cholecalciferol (VITAMIN D) 2000 UNITS tablet Take 2,000 Units by mouth daily.    . Coenzyme Q10 (CO Q 10 PO) Take by mouth daily.    . diclofenac sodium (VOLTAREN) 1 % GEL Apply 2 g topically 4 (four) times daily as needed. 100 g 3  . Flaxseed, Linseed, (FLAXSEED OIL PO) Take by mouth daily.    . meloxicam (MOBIC) 15 MG tablet Take 1 tablet (15 mg total) by mouth daily as needed for pain (may take 1/2). 30 tablet 3  . Omega 3-6-9 Fatty Acids (OMEGA-3-6-9 PO) Take by mouth daily.    Marland Kitchen omeprazole (PRILOSEC) 20 MG capsule Take 20 mg by mouth daily.    Marland Kitchen PINE BARK, PYCNOGENOL, PO Take by mouth daily.    . Red Yeast Rice Extract (RED YEAST RICE PO) Take by mouth daily.    . TURMERIC PO Take by mouth daily.    Marland Kitchen UNABLE TO FIND Med Name: Mangosistine.  Takes 2 tablets daily     No current  facility-administered medications for this visit.    Objective: BP 160/90 mmHg  Pulse 67  Temp(Src) 97.7 F (36.5 C)  Wt 196 lb (88.905 kg) Gen: NAD, resting comfortably CV: RRR no murmurs rubs or gallops Lungs: CTAB no crackles, wheeze, rhonchi Abdomen: soft/nontender/nondistended/normal bowel sounds.  YQM:GNOIBB hindfoot valgus. Hypertrophy of lateral malleolus. Left foot small compared to right. Trace edema left ankle.  Skin: warm, dry Right buttocks 2 x2 indurated area without fluctuance, erythematous, mildly tender to palpation  Assessment/Plan:  Tick Bite -local irritation noted. No obvious abscess or cellulitis. no obvious signs of Lyme or RMSF but gave return precautions.   Arthritis of left ankle 2 week trial of mobic not effective. We will trial prior authorization for voltaren gel. I would much prefer topical to systemic if this can be approved.    Patient states she was in some discomfort while sitting and getting BP checked. We will follow Bp next visit, if remains high may need Bp support.

## 2014-04-21 NOTE — Progress Notes (Signed)
PA for Voltaren was approved.  I called Michelle Howell and they are going to get the script ready for pick up. Also, you have seen the patient a few times since last year but you are not change to her PCP, is it okay to change?

## 2014-05-25 ENCOUNTER — Telehealth: Payer: Self-pay | Admitting: *Deleted

## 2014-05-25 NOTE — Telephone Encounter (Signed)
Left message for pt to call back.  Need to know when pt had mammogram, when and where

## 2015-09-17 ENCOUNTER — Ambulatory Visit (INDEPENDENT_AMBULATORY_CARE_PROVIDER_SITE_OTHER): Payer: BLUE CROSS/BLUE SHIELD | Admitting: Family Medicine

## 2015-09-17 ENCOUNTER — Encounter: Payer: Self-pay | Admitting: Family Medicine

## 2015-09-17 VITALS — BP 154/96 | HR 74 | Temp 98.2°F | Wt 186.2 lb

## 2015-09-17 DIAGNOSIS — M79604 Pain in right leg: Secondary | ICD-10-CM

## 2015-09-17 DIAGNOSIS — IMO0001 Reserved for inherently not codable concepts without codable children: Secondary | ICD-10-CM

## 2015-09-17 DIAGNOSIS — R030 Elevated blood-pressure reading, without diagnosis of hypertension: Secondary | ICD-10-CM

## 2015-09-17 MED ORDER — PREDNISONE 20 MG PO TABS: ORAL_TABLET | ORAL | 0 refills | Status: DC

## 2015-09-17 NOTE — Patient Instructions (Addendum)
Prednisone for 7 days to see if we can calm this down (likely will only work if coming from the back)  Can send to orthopedics if not doing better after a week  See me back in a month to check blood pressure when pain level is hopefully better- I am concerned you may need medicine. Look at dash eating plan below which may help avoid this need  DASH Eating Plan DASH stands for "Dietary Approaches to Stop Hypertension." The DASH eating plan is a healthy eating plan that has been shown to reduce high blood pressure (hypertension). Additional health benefits may include reducing the risk of type 2 diabetes mellitus, heart disease, and stroke. The DASH eating plan may also help with weight loss. WHAT DO I NEED TO KNOW ABOUT THE DASH EATING PLAN? For the DASH eating plan, you will follow these general guidelines:  Choose foods with a percent daily value for sodium of less than 5% (as listed on the food label).  Use salt-free seasonings or herbs instead of table salt or sea salt.  Check with your health care provider or pharmacist before using salt substitutes.  Eat lower-sodium products, often labeled as "lower sodium" or "no salt added."  Eat fresh foods.  Eat more vegetables, fruits, and low-fat dairy products.  Choose whole grains. Look for the word "whole" as the first word in the ingredient list.  Choose fish and skinless chicken or [place] more often than red meat. Limit fish, poultry, and meat to 6 oz (170 g) each day.  Limit sweets, desserts, sugars, and sugary drinks.  Choose heart-healthy fats.  Limit cheese to 1 oz (28 g) per day.  Eat more home-cooked food and less restaurant, buffet, and fast food.  Limit fried foods.  Cook foods using methods other than frying.  Limit canned vegetables. If you do use them, rinse them well to decrease the sodium.  When eating at a restaurant, ask that your food be prepared with less salt, or no salt if possible. WHAT FOODS CAN I  EAT? Seek help from a dietitian for individual calorie needs. Grains Whole grain or whole wheat bread. Brown rice. Whole grain or whole wheat pasta. Quinoa, bulgur, and whole grain cereals. Low-sodium cereals. Corn or whole wheat flour tortillas. Whole grain cornbread. Whole grain crackers. Low-sodium crackers. Vegetables Fresh or frozen vegetables (raw, steamed, roasted, or grilled). Low-sodium or reduced-sodium tomato and vegetable juices. Low-sodium or reduced-sodium tomato sauce and paste. Low-sodium or reduced-sodium canned vegetables.  Fruits All fresh, canned (in natural juice), or frozen fruits. Meat and Other Protein Products Ground beef (85% or leaner), grass-fed beef, or beef trimmed of fat. Skinless chicken or [place]. Ground chicken or [place]. Pork trimmed of fat. All fish and seafood. Eggs. Dried beans, peas, or lentils. Unsalted nuts and seeds. Unsalted canned beans. Dairy Low-fat dairy products, such as skim or 1% milk, 2% or reduced-fat cheeses, low-fat ricotta or cottage cheese, or plain low-fat yogurt. Low-sodium or reduced-sodium cheeses. Fats and Oils Tub margarines without trans fats. Light or reduced-fat mayonnaise and salad dressings (reduced sodium). Avocado. Safflower, olive, or canola oils. Natural peanut or almond butter. Other Unsalted popcorn and pretzels. The items listed above may not be a complete list of recommended foods or beverages. Contact your dietitian for more options. WHAT FOODS ARE NOT RECOMMENDED? Grains White bread. White pasta. White rice. Refined cornbread. Bagels and croissants. Crackers that contain trans fat. Vegetables Creamed or fried vegetables. Vegetables in a cheese sauce. Regular canned vegetables. Regular  canned tomato sauce and paste. Regular tomato and vegetable juices. Fruits Dried fruits. Canned fruit in light or heavy syrup. Fruit juice. Meat and Other Protein Products Fatty cuts of meat. Ribs, chicken wings, bacon, sausage,  bologna, salami, chitterlings, fatback, hot dogs, bratwurst, and packaged luncheon meats. Salted nuts and seeds. Canned beans with salt. Dairy Whole or 2% milk, cream, half-and-half, and cream cheese. Whole-fat or sweetened yogurt. Full-fat cheeses or blue cheese. Nondairy creamers and whipped toppings. Processed cheese, cheese spreads, or cheese curds. Condiments Onion and garlic salt, seasoned salt, table salt, and sea salt. Canned and packaged gravies. Worcestershire sauce. Tartar sauce. Barbecue sauce. Teriyaki sauce. Soy sauce, including reduced sodium. Steak sauce. Fish sauce. Oyster sauce. Cocktail sauce. Horseradish. Ketchup and mustard. Meat flavorings and tenderizers. Bouillon cubes. Hot sauce. Tabasco sauce. Marinades. Taco seasonings. Relishes. Fats and Oils Butter, stick margarine, lard, shortening, ghee, and bacon fat. Coconut, palm kernel, or palm oils. Regular salad dressings. Other Pickles and olives. Salted popcorn and pretzels. The items listed above may not be a complete list of foods and beverages to avoid. Contact your dietitian for more information. WHERE CAN I FIND MORE INFORMATION? National Heart, Lung, and Blood Institute: travelstabloid.com   This information is not intended to replace advice given to you by your health care provider. Make sure you discuss any questions you have with your health care provider.   Document Released: 12/12/2010 Document Revised: 01/13/2014 Document Reviewed: 10/27/2012 Elsevier Interactive Patient Education [employer].

## 2015-09-17 NOTE — Progress Notes (Signed)
Subjective:  BLONDIE CLUNE is a 60 y.o. year old very pleasant female patient who presents for/with See problem oriented charting ROS- no fever, chills, leg weakness, fecal or urinary incontinence, saddle anesthesia.see any ROS included in HPI as well.   Past Medical History-  Patient Active Problem List   Diagnosis Date Noted  . Hyperlipidemia 08/12/2013    Priority: Medium  . Arthritis of left ankle 08/12/2013    Priority: Low  . Preventative health care 08/04/2011    Priority: Low    Medications- reviewed and updated Current Outpatient Prescriptions  Medication Sig Dispense Refill  . Cholecalciferol (VITAMIN D) 2000 UNITS tablet Take 2,000 Units by mouth daily.    . Coenzyme Q10 (CO Q 10 PO) Take by mouth daily.    . diclofenac sodium (VOLTAREN) 1 % GEL Apply 2 g topically 4 (four) times daily as needed. 100 g 3  . Flaxseed, Linseed, (FLAXSEED OIL PO) Take by mouth daily.    Ernestine Conrad 3-6-9 Fatty Acids (OMEGA-3-6-9 PO) Take by mouth daily.    Marland Kitchen omeprazole (PRILOSEC) 20 MG capsule Take 20 mg by mouth daily.    Marland Kitchen PINE BARK, PYCNOGENOL, PO Take by mouth daily.    . Red Yeast Rice Extract (RED YEAST RICE PO) Take by mouth daily.    . TURMERIC PO Take by mouth daily.    Marland Kitchen UNABLE TO FIND Med Name: Mangosistine.  Takes 2 tablets daily     Objective: BP (!) 154/96   Pulse 74   Temp 98.2 F (36.8 C) (Oral)   Wt 186 lb 3.2 oz (84.5 kg)   SpO2 97%   BMI 29.16 kg/m  Gen: NAD, resting comfortably CV: RRR no murmurs rubs or gallops Lungs: CTAB no crackles, wheeze, rhonchi Abdomen: soft/nontender/nondistended/normal bowel sounds. No rebound or guarding.  Ext: no edema Skin: warm, dry Back - Normal skin, Spine with normal alignment and no deformity.  No tenderness to vertebral process palpation.  Paraspinous muscles are not tender and without spasm.   Range of motion is full at neck and lumbar sacral regions. Negative Straight leg raise. Patient with pain to palpation of right  buttocks.  Neuro- no saddle anesthesia, 5/5 strength lower extremities, 2+ reflexes  Assessment/Plan:  Right buttocks pain radiating down to calf S: lunged in tennis for a drop shot and felt a pop in right buttocks last Tuesday. Didn't really hurt at first but later that night she noted some tightening in her muscles in rear. Turned a lot in bed and that seemed to make it worse. 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 Did some stretching at work and seemed to worsen/tighten- thankfully didn't play tennis that night. Was up to 10/10 and felt twitches/spasm in buttocks- some radiation into leg. Took ibuprofen at home as well as tramadol- neither helped. Also has tried icing in 20 minute sections- helped just short term. Took Friday off and felt better but that night flared up again. Ibuprofen helped the most. Had some old robaxin and that didn't help. Saturday walked for 6 hours at [employer]- felt better and had not taken ibuprofen. Pain intensified Saturday night into Sunday morning- was crying. Scheduled ibuprofen 800mg  every 4 hours to pain 2-3/10 A/P: 60 year old female with right buttocks pain radiating into legs. Could be DDD, could be pulled muscle. Want to try to reduce ibuprofen usage- will trial prednisone course and plan on ortho follow up if not improving on this regimen within a week vs.  Sports medicine. Advised her to take it easy relatively and asked her not to play tennis this weekend. 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    S: noted poorly controlled blood pressure- the plan was for patien tto return after last visit but this was never completed.  BP Readings from Last 3 Encounters:  09/17/15 (!) 154/96  04/20/14 (!) 160/90  08/12/13 (!) 140/98  A/P:Continue no meds for now- gave dash eating plan and advised follow up within a month to reevaluate- if consistently high will likely need to start meds at that  point. Think pain is contributing so hopes are that prednisone will actually improve BP also she will cut down on nsaids   Meds ordered this encounter  Medications  . predniSONE (DELTASONE) 20 MG tablet    Sig: Take 2 pills for 3 days, 1 pill for 4 days    Dispense:  10 tablet    Refill:  0    Return precautions advised.  Garret Reddish, MD

## 2015-09-25 ENCOUNTER — Telehealth: Payer: Self-pay | Admitting: Family Medicine

## 2015-09-25 ENCOUNTER — Other Ambulatory Visit: Payer: Self-pay

## 2015-09-25 DIAGNOSIS — M25561 Pain in right knee: Secondary | ICD-10-CM

## 2015-09-25 NOTE — Telephone Encounter (Signed)
Referral placed to Orthopedics

## 2015-09-25 NOTE — Telephone Encounter (Signed)
Pt would like to proceed with referral to [facility] Dr. Margit Banda   Pt would like to discuss lidocaine patches prior to referral. [facility]/ [place]

## 2015-09-25 NOTE — Telephone Encounter (Signed)
Yes thanks, [facility] referral can be placed- ask them to try to work her in within a week- they can make a decision on lidocaine patches as they can usually get folks in quickly

## 2015-12-11 ENCOUNTER — Other Ambulatory Visit: Payer: Self-pay

## 2015-12-11 MED ORDER — OMEPRAZOLE 20 MG PO CPDR: 20.0000 mg | DELAYED_RELEASE_CAPSULE | Freq: Every day | ORAL | 1 refills | Status: DC

## 2016-12-26 ENCOUNTER — Other Ambulatory Visit: Payer: Self-pay | Admitting: Family Medicine

## 2016-12-26 LAB — HM MAMMOGRAPHY

## 2016-12-29 LAB — HM PAP SMEAR: HM Pap smear: NEGATIVE

## 2017-01-15 ENCOUNTER — Ambulatory Visit (INDEPENDENT_AMBULATORY_CARE_PROVIDER_SITE_OTHER): Payer: PRIVATE HEALTH INSURANCE | Admitting: Family Medicine

## 2017-01-15 ENCOUNTER — Encounter: Payer: Self-pay | Admitting: Family Medicine

## 2017-01-15 VITALS — BP 130/82 | HR 77 | Temp 98.7°F | Ht 67.0 in | Wt 196.2 lb

## 2017-01-15 DIAGNOSIS — R102 Pelvic and perineal pain: Secondary | ICD-10-CM

## 2017-01-15 LAB — COMPREHENSIVE METABOLIC PANEL
ALK PHOS: 56 U/L (ref 39–117)
ALT: 20 U/L (ref 0–35)
AST: 17 U/L (ref 0–37)
Albumin: 4.2 g/dL (ref 3.5–5.2)
BILIRUBIN TOTAL: 0.7 mg/dL (ref 0.2–1.2)
BUN: 17 mg/dL (ref 6–23)
CO2: 29 mEq/L (ref 19–32)
CREATININE: 0.63 mg/dL (ref 0.40–1.20)
Calcium: 9.4 mg/dL (ref 8.4–10.5)
Chloride: 101 mEq/L (ref 96–112)
GFR: 101.97 mL/min (ref >60.00)
GLUCOSE: 94 mg/dL (ref 70–99)
Potassium: 3.6 mEq/L (ref 3.5–5.1)
Sodium: 140 mEq/L (ref 135–145)
TOTAL PROTEIN: 7.1 g/dL (ref 6.0–8.3)

## 2017-01-15 LAB — CBC WITH DIFFERENTIAL/PLATELET
BASOS ABS: 0 10*3/uL (ref 0.0–0.1)
Basophils Relative: 0.6 % (ref 0.0–3.0)
Eosinophils Absolute: 0.2 10*3/uL (ref 0.0–0.7)
Eosinophils Relative: 2.8 % (ref 0.0–5.0)
HEMATOCRIT: 37.5 % (ref 36.0–46.0)
Hemoglobin: 12.6 g/dL (ref 12.0–15.0)
LYMPHS PCT: 25.6 % (ref 12.0–46.0)
Lymphs Abs: 1.9 10*3/uL (ref 0.7–4.0)
MCHC: 33.7 g/dL (ref 30.0–36.0)
MCV: 92.1 fl (ref 78.0–100.0)
MONOS PCT: 6.7 % (ref 3.0–12.0)
Monocytes Absolute: 0.5 10*3/uL (ref 0.1–1.0)
Neutro Abs: 4.8 10*3/uL (ref 1.4–7.7)
Neutrophils Relative %: 64.3 % (ref 43.0–77.0)
Platelets: 230 10*3/uL (ref 150.0–400.0)
RBC: 4.07 Mil/uL (ref 3.87–5.11)
RDW: 12.2 % (ref 11.5–15.5)
WBC: 7.4 10*3/uL (ref 4.0–10.5)

## 2017-01-15 LAB — POC URINALSYSI DIPSTICK (AUTOMATED)
BILIRUBIN UA: NEGATIVE
Glucose, UA: NEGATIVE
Leukocytes, UA: NEGATIVE
Nitrite, UA: NEGATIVE
PH UA: 7 (ref 5.0–8.0)
Protein, UA: NEGATIVE
RBC UA: NEGATIVE
SPEC GRAV UA: 1.015 (ref 1.010–1.025)
Urobilinogen, UA: 0.2 E.U./dL

## 2017-01-15 NOTE — Patient Instructions (Signed)
Please stop by lab before you go  We discussed doing CT scan due to severity of your pain (let me know if you change your mind- early Friday morning would give [place] a better chance of getting it done that day). You preferred to wait on labs first. If you have drastic worsening of your pain, call 911 or go to ER

## 2017-01-15 NOTE — Progress Notes (Signed)
Subjective:  Michelle Howell is a 62 y.o. year old very pleasant female patient who presents for/with See problem oriented charting ROS- has had subjective fever and has had chills. No vomiting. Has had lower abdominal pain to very high levels. No dysuria or polyuria.   Past Medical History-  Patient Active Problem List   Diagnosis Date Noted  . Hyperlipidemia 08/12/2013    Priority: Medium  . Arthritis of left ankle 08/12/2013    Priority: Low  . Preventative health care 08/04/2011    Priority: Low    Medications- reviewed and updated Current Outpatient Medications  Medication Sig Dispense Refill  . Cholecalciferol (VITAMIN D) 2000 UNITS tablet Take 2,000 Units by mouth daily.    . Coenzyme Q10 (CO Q 10 PO) Take by mouth daily.    . diclofenac sodium (VOLTAREN) 1 % GEL Apply 2 g topically 4 (four) times daily as needed. 100 g 3  . Flaxseed, Linseed, (FLAXSEED OIL PO) Take by mouth daily.    Ernestine Conrad 3-6-9 Fatty Acids (OMEGA-3-6-9 PO) Take by mouth daily.    Marland Kitchen omeprazole (PRILOSEC) 20 MG capsule TAKE ONE CAPSULE BY MOUTH DAILY 90 capsule 0  . PINE BARK, PYCNOGENOL, PO Take by mouth daily.    . Red Yeast Rice Extract (RED YEAST RICE PO) Take by mouth daily.    . TURMERIC PO Take by mouth daily.    Marland Kitchen UNABLE TO FIND Med Name: Mangosistine.  Takes 2 tablets daily     Objective: BP 130/82 (BP Location: Left Arm, Patient Position: Sitting, Cuff Size: Large)   Pulse 77   Temp 98.7 F (37.1 C) (Oral)   Ht '5\' 7"'  (1.702 m)   Wt 196 lb 3.2 oz (89 kg)   SpO2 96%   BMI 30.73 kg/m  Gen: NAD, appears somewhat fatigued CV: RRR no murmurs rubs or gallops Lungs: CTAB no crackles, wheeze, rhonchi Abdomen: soft/slightly distended. Suprapubic pain moderate in intensity- slight RLQ and LLQ pain as well. No rebound or guarding.  Ext: no edema Skin: warm, dry, no rash  Results for orders placed or performed in visit on 01/15/17 (from the past 24 hour(s))  CBC with Differential/Platelet      Status: None   Collection Time: 01/15/17  2:32 PM  Result Value Ref Range   WBC 7.4 4.0 - 10.5 K/uL   RBC 4.07 3.87 - 5.11 Mil/uL   Hemoglobin 12.6 12.0 - 15.0 g/dL   HCT 37.5 36.0 - 46.0 %   MCV 92.1 78.0 - 100.0 fl   MCHC 33.7 30.0 - 36.0 g/dL   RDW 12.2 11.5 - 15.5 %   Platelets 230.0 150.0 - 400.0 K/uL   Neutrophils Relative % 64.3 43.0 - 77.0 %   Lymphocytes Relative 25.6 12.0 - 46.0 %   Monocytes Relative 6.7 3.0 - 12.0 %   Eosinophils Relative 2.8 0.0 - 5.0 %   Basophils Relative 0.6 0.0 - 3.0 %   Neutro Abs 4.8 1.4 - 7.7 K/uL   Lymphs Abs 1.9 0.7 - 4.0 K/uL   Monocytes Absolute 0.5 0.1 - 1.0 K/uL   Eosinophils Absolute 0.2 0.0 - 0.7 K/uL   Basophils Absolute 0.0 0.0 - 0.1 K/uL  Comprehensive metabolic panel     Status: None   Collection Time: 01/15/17  2:32 PM  Result Value Ref Range   Sodium 140 135 - 145 mEq/L   Potassium 3.6 3.5 - 5.1 mEq/L   Chloride 101 96 - 112 mEq/L   CO2 29  19 - 32 mEq/L   Glucose, Bld 94 70 - 99 mg/dL   BUN 17 6 - 23 mg/dL   Creatinine, Ser 0.63 0.40 - 1.20 mg/dL   Total Bilirubin 0.7 0.2 - 1.2 mg/dL   Alkaline Phosphatase 56 39 - 117 U/L   AST 17 0 - 37 U/L   ALT 20 0 - 35 U/L   Total Protein 7.1 6.0 - 8.3 g/dL   Albumin 4.2 3.5 - 5.2 g/dL   Calcium 9.4 8.4 - 10.5 mg/dL   GFR 101.97 >60.00 mL/min  POCT Urinalysis Dipstick (Automated)     Status: None   Collection Time: 01/15/17  2:54 PM  Result Value Ref Range   Color, UA Yellow    Clarity, UA Clear    Glucose, UA Negative    Bilirubin, UA Negative    Ketones, UA (small) 1+    Spec Grav, UA 1.015 1.010 - 1.025   Blood, UA Negative    pH, UA 7.0 5.0 - 8.0   Protein, UA Negative    Urobilinogen, UA 0.2 0.2 or 1.0 E.U./dL   Nitrite, UA Negative    Leukocytes, UA Negative Negative    Assessment/Plan:  Suprapubic pain - Plan: CBC with Differential/Platelet, Comprehensive metabolic panel, POCT Urinalysis Dipstick (Automated) S: on Tuesday had subjective fever- didn't measure.  Also had chills. Started with suprapubic pain around that time. With meal last night had slight epigastric pain. Pain was "knee buckling" but has calmed down fair amount. has had some distension/bloating- pants have been tighter. Alk seltzer seemed to help some Tuesday night and took some yesterday as well.   Also had pain/pressure with bowel movement or peeing in the area then felt better. Now only having pain with bowel movement then feels better. Was having BM daily then Tuesday had 10 small BMs that relieved pain- not loose or diarrhea.   No dysuria. No increased urination. No worsening nocturia.  A/P: UA without concern for UTI. CBC without WBC elevation. CMP normal. I am not sure cause of pain- patient is concerned about diverticulitis and appendicitis. Pain oddly is improving slightly today. We discussed getting CT scan but she wants to do this only if symptoms worsen or fail to improve or if had WBC elevation. She will also call me tomorrow morning if she changes her mind so we can try to get this set up for Friday.   The duration of face-to-face time during this visit was greater than 25 minutes. Greater than 50% of this time was spent in counseling, explanation of diagnosis, planning of further management, and/or coordination of care including discussing potential causes, reviewing planned workup as well as  Return precautions  Garret Reddish, MD

## 2017-01-16 ENCOUNTER — Encounter: Payer: Self-pay | Admitting: Family Medicine

## 2017-10-08 ENCOUNTER — Encounter: Payer: Self-pay | Admitting: Family Medicine

## 2017-10-08 ENCOUNTER — Ambulatory Visit (INDEPENDENT_AMBULATORY_CARE_PROVIDER_SITE_OTHER): Payer: PRIVATE HEALTH INSURANCE | Admitting: Family Medicine

## 2017-10-08 VITALS — BP 138/70 | HR 68 | Temp 97.8°F | Ht 67.0 in | Wt 189.4 lb

## 2017-10-08 DIAGNOSIS — Z23 Encounter for immunization: Secondary | ICD-10-CM

## 2017-10-08 DIAGNOSIS — K219 Gastro-esophageal reflux disease without esophagitis: Secondary | ICD-10-CM

## 2017-10-08 DIAGNOSIS — R1030 Lower abdominal pain, unspecified: Secondary | ICD-10-CM

## 2017-10-08 DIAGNOSIS — R21 Rash and other nonspecific skin eruption: Secondary | ICD-10-CM | POA: Diagnosis not present

## 2017-10-08 DIAGNOSIS — R10819 Abdominal tenderness, unspecified site: Secondary | ICD-10-CM

## 2017-10-08 LAB — POC URINALSYSI DIPSTICK (AUTOMATED)
BILIRUBIN UA: NEGATIVE
Blood, UA: NEGATIVE
GLUCOSE UA: NEGATIVE
KETONES UA: NEGATIVE
Leukocytes, UA: NEGATIVE
Nitrite, UA: NEGATIVE
Protein, UA: NEGATIVE
SPEC GRAV UA: 1.015 (ref 1.010–1.025)
Urobilinogen, UA: 0.2 E.U./dL
pH, UA: 6.5 (ref 5.0–8.0)

## 2017-10-08 MED ORDER — OMEPRAZOLE 20 MG PO CPDR: 20.0000 mg | DELAYED_RELEASE_CAPSULE | Freq: Every day | ORAL | 1 refills | Status: DC

## 2017-10-08 MED ORDER — TRIAMCINOLONE ACETONIDE 0.1 % EX CREA: 1.0000 "application " | TOPICAL_CREAM | Freq: Two times a day (BID) | CUTANEOUS | 0 refills | Status: DC

## 2017-10-08 NOTE — Patient Instructions (Addendum)
Tdap today - thanks for doing this  Trial omeprazole for good 2 months- this would help if upper abdomen symptoms are reflux or ulcer related   Trial triamcinolone for rash in armpit area. Twice a day for 2 weeks. Avoid any deodorants or anything else coming in contact with area. Stop triamcinolone if symptoms worsen- in that case may try plain vaseline 2-3x a day for a few weeks- if not improved then- we can refer to dermatology  Please stop by lab before you go  I want to know if your lower abdominal symptoms worsen, recur, or fail to improve- could consider CT scan abdomen/pelvis

## 2017-10-08 NOTE — Progress Notes (Signed)
Subjective:  Michelle Howell is a 62 y.o. year old very pleasant female patient who presents for/with See problem oriented charting ROS- subjective fever. Some sweats. No chest pain or shortness of breath. No vomiting.    Past Medical History-  Patient Active Problem List   Diagnosis Date Noted  . Hyperlipidemia 08/12/2013    Priority: Medium  . GERD (gastroesophageal reflux disease) 10/10/2017    Priority: Low  . Arthritis of left ankle 08/12/2013    Priority: Low  . Preventative health care 08/04/2011    Priority: Low    Medications- reviewed and updated Current Outpatient Medications  Medication Sig Dispense Refill  . Ascorbic Acid (VITAMIN C) 1000 MG tablet Take 1,000 mg by mouth daily.    . Cholecalciferol (VITAMIN D) 2000 UNITS tablet Take 2,000 Units by mouth daily.    . Coenzyme Q10 (CO Q 10 PO) Take by mouth daily.    . diclofenac sodium (VOLTAREN) 1 % GEL Apply 2 g topically 4 (four) times daily as needed. 100 g 3  . ibuprofen (ADVIL,MOTRIN) 800 MG tablet Take 800 mg by mouth every 8 (eight) hours as needed.    Ernestine Conrad 3-6-9 Fatty Acids (OMEGA-3-6-9 PO) Take by mouth daily.    Marland Kitchen omeprazole (PRILOSEC) 20 MG capsule Take 1 capsule (20 mg total) by mouth daily. 90 capsule 1  . PINE BARK, PYCNOGENOL, PO Take by mouth daily.    . Red Yeast Rice Extract (RED YEAST RICE PO) Take by mouth daily.    . TURMERIC PO Take by mouth daily.    Marland Kitchen UNABLE TO FIND Med Name: Mangosistine.  Takes 2 tablets daily    . triamcinolone cream (KENALOG) 0.1 % Apply 1 application topically 2 (two) times daily. For 7-10 days maximum 80 g 0   No current facility-administered medications for this visit.     Objective: BP 138/70 (BP Location: Left Arm, Patient Position: Sitting, Cuff Size: Large)   Pulse 68   Temp 97.8 F (36.6 C) (Oral)   Ht 5\' 7"  (1.702 m)   Wt 189 lb 6.4 oz (85.9 kg)   SpO2 96%   BMI 29.66 kg/m  Gen: NAD, resting comfortably CV: RRR no murmurs rubs or gallops Lungs: CTAB  no crackles, wheeze, rhonchi Abdomen: soft/nontender except mild tenderness in lower abdomen/nondistended/normal bowel sounds. No rebound or guarding. No obvious masses Ext: no edema Skin: warm, dry, in right axilla band of erythematous skin with some scaling at edges about 3-4 x 8 cm  Assessment/Plan:  Other notes: 1. 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   Lower abdominal pain Suprapubic tenderness - Plan: POCT Urinalysis Dipstick (Automated) S:  Similar symptoms to last January with lower abdominal pain- right above pubic bone. Started on Saturday- was at a cookout and had green peppers and onions and was on empty stomach- wonders if just bad gas. Usually not gassy so wonders if when she gets gas if it just feels more severe. Had severe cramps in that area. No bowel movement that day. Pain improved with peeing slightly. Alka seltzer helped. Subjective fever and some sweating on Saturday night.   Sunday felt better but had some soreness in lower abdomen. Was able to play nearly 3 hours tennis match- pain didn't worsen during that and didn't even notice the pain. Did CBD oil in Am- usually takes in AM- and that seemed to be all she needed for joints. Very mild pressure  lingering in lower abdomen at this point.  A/P: She states she is Most worried about uterine cancer, bladder cancer, or ulcer. She think some of this is mental/emotional. 12/29/16 had pap smear and pelvic exam with gynecology. No abnormal uterine bleeding/spotting. We discussed lower risk for uterine cancer. Will get UA and if any blood- consider referral to urology (there was none). Also doesn't look like UTI. Since pain is improving we opted to monitor  From avs  "I want to know if your lower abdominal symptoms worsen, recur, or fail to improve- could consider CT scan abdomen/pelvis"  Rash S:about a month of redness in right axilla. Not pruritic. Band like rash. Plays tennis with a  physician who recommended antifungal trial but had no improvement. Has tried drying powders without much relief either.  ROS-not ill appearing, no fever/chills. No new medications. Not immunocompromised. No mucus membrane involvement.  A/P: May be more of irritant rash/chaffing in axilla. Given lack of improvement with antifungal doubt that as cause From avs "Trial triamcinolone for rash in armpit area. Twice a day for 2 weeks. Avoid any deodorants or anything else coming in contact with area. Stop triamcinolone if symptoms worsen- in that case may try plain vaseline 2-3x a day for a few weeks- if not improved then- we can refer to dermatology"  GERD (gastroesophageal reflux disease) S:She also states feels like she fills up easily when eating and feels tightness in upper abdomen (with anxiety) - going on for several months at least. Does admit to some acid reflux intermittently- like a burning/discomfort in upper stomach.   She has taken omeprazole in the past but right now is only taking intermittently.  A/P: upper abdominal symptoms may reflect GERD/reflux. from avs "Trial omeprazole for good 2 months- this would help if upper abdomen symptoms are reflux or ulcer related "  PRN followup if not improving  Lab/Order associations: Lower abdominal pain  Suprapubic tenderness - Plan: POCT Urinalysis Dipstick (Automated)  Need for prophylactic vaccination with combined diphtheria-tetanus-pertussis (DTP) vaccine - Plan: Tdap vaccine greater than or equal to 7yo IM  Gastroesophageal reflux disease without esophagitis  Meds ordered this encounter  Medications  . omeprazole (PRILOSEC) 20 MG capsule    Sig: Take 1 capsule (20 mg total) by mouth daily.    Dispense:  90 capsule    Refill:  1  . triamcinolone cream (KENALOG) 0.1 %    Sig: Apply 1 application topically 2 (two) times daily. For 7-10 days maximum    Dispense:  80 g    Refill:  0    Return precautions advised.  Garret Reddish,  MD

## 2017-10-10 DIAGNOSIS — K219 Gastro-esophageal reflux disease without esophagitis: Secondary | ICD-10-CM | POA: Insufficient documentation

## 2017-10-10 NOTE — Assessment & Plan Note (Addendum)
S:She also states feels like she fills up easily when eating and feels tightness in upper abdomen (with anxiety) - going on for several months at least. Does admit to some acid reflux intermittently- like a burning/discomfort in upper stomach.   She has taken omeprazole in the past but right now is only taking intermittently.  A/P: upper abdominal symptoms may reflect GERD/reflux. from avs "Trial omeprazole for good 2 months- this would help if upper abdomen symptoms are reflux or ulcer related "

## 2017-11-24 ENCOUNTER — Ambulatory Visit (INDEPENDENT_AMBULATORY_CARE_PROVIDER_SITE_OTHER): Payer: PRIVATE HEALTH INSURANCE | Admitting: Family Medicine

## 2017-11-24 ENCOUNTER — Encounter: Payer: Self-pay | Admitting: Family Medicine

## 2017-11-24 VITALS — BP 142/88 | HR 90 | Temp 97.4°F | Ht 67.0 in | Wt 192.0 lb

## 2017-11-24 DIAGNOSIS — E663 Overweight: Secondary | ICD-10-CM | POA: Diagnosis not present

## 2017-11-24 DIAGNOSIS — R030 Elevated blood-pressure reading, without diagnosis of hypertension: Secondary | ICD-10-CM

## 2017-11-24 DIAGNOSIS — M542 Cervicalgia: Secondary | ICD-10-CM | POA: Diagnosis not present

## 2017-11-24 DIAGNOSIS — I10 Essential (primary) hypertension: Secondary | ICD-10-CM | POA: Insufficient documentation

## 2017-11-24 MED ORDER — BUPROPION HCL ER (XL) 150 MG PO TB24: 150.0000 mg | ORAL_TABLET | Freq: Every day | ORAL | 5 refills | Status: DC

## 2017-11-24 NOTE — Assessment & Plan Note (Signed)
S: patient with poor appetite control.  She typically exercises regularly with tennis.  She tries to eat a reasonably healthy diet but sometimes just has a very large appetite.  She admits to a lot of stress and this can also lead to excess eating A/P: She asks about trying Wellbutrin for appetite suppression.  She also struggles with stress management at times- I believe a trial of Wellbutrin over the next 3-6 months at 150 mg extended release would be very reasonable-send this in for her

## 2017-11-24 NOTE — Assessment & Plan Note (Signed)
S: Blood pressure poorly controlled on no medication.  She attributes this to a lot of stress- Has to buy a car and that has been weighting heavily on her. Timing belt going on car- doesn't want to spend 2k on old vehicle. Tough few weeks- sister with C. Diff and almost died- life support for almost a week- also had mild stroke BP Readings from Last 3 Encounters:  11/24/17 (!) 142/88  10/08/17 138/70  01/15/17 130/82  A/P: We discussed blood pressure goal of <140/90.  She is going to do some home monitoring-if blood pressure remains high which I am concerned it may as it seems to have been trending up- would likely start amlodipine.  We may do this through phone call or my chart if she signs up.

## 2017-11-24 NOTE — Patient Instructions (Addendum)
Your blood pressure trend concerns me. I would like for you to check at [place] at least 1x a week. Your goal is <140/90. If you note in the next few weeks that it is higher than our goal- see me back as we may be at a time we need to consider either serious lifestyle changes or medication.   wellbutrin to help with current stressors and to help with hopefully curbing appetite. If you do not find helpful within 1-3 months, may stop- this is lowest dose and extended release so hard to give a good taper off this.   Glad the neck is improving- if it fails to continue to improve let me know and I can order the ultrasound of the carotids- if persisted beyond that could consider neck CT

## 2017-11-24 NOTE — Progress Notes (Signed)
Subjective:  Michelle Howell is a 62 y.o. year old very pleasant female patient who presents for/with See problem oriented charting ROS-no fever, chills, nausea, vomiting.  No dizziness, blurry vision, headaches reported.  Past Medical History-  Patient Active Problem List   Diagnosis Date Noted  . Elevated blood pressure reading 11/24/2017    Priority: Medium  . Hyperlipidemia 08/12/2013    Priority: Medium  . GERD (gastroesophageal reflux disease) 10/10/2017    Priority: Low  . Arthritis of left ankle 08/12/2013    Priority: Low  . Overweight (BMI 25.0-29.9) 11/24/2017    Medications- reviewed and updated Current Outpatient Medications  Medication Sig Dispense Refill  . Ascorbic Acid (VITAMIN C) 1000 MG tablet Take 1,000 mg by mouth daily.    . Cholecalciferol (VITAMIN D) 2000 UNITS tablet Take 2,000 Units by mouth daily.    . Coenzyme Q10 (CO Q 10 PO) Take by mouth daily.    . diclofenac sodium (VOLTAREN) 1 % GEL Apply 2 g topically 4 (four) times daily as needed. 100 g 3  . ibuprofen (ADVIL,MOTRIN) 800 MG tablet Take 800 mg by mouth every 8 (eight) hours as needed.    Ernestine Conrad 3-6-9 Fatty Acids (OMEGA-3-6-9 PO) Take by mouth daily.    Marland Kitchen omeprazole (PRILOSEC) 20 MG capsule Take 1 capsule (20 mg total) by mouth daily. 90 capsule 1  . PINE BARK, PYCNOGENOL, PO Take by mouth daily.    . Red Yeast Rice Extract (RED YEAST RICE PO) Take by mouth daily.    Marland Kitchen triamcinolone cream (KENALOG) 0.1 % Apply 1 application topically 2 (two) times daily. For 7-10 days maximum 80 g 0  . TURMERIC PO Take by mouth daily.    Marland Kitchen UNABLE TO FIND Med Name: Mangosistine.  Takes 2 tablets daily    . buPROPion (WELLBUTRIN XL) 150 MG 24 hr tablet Take 1 tablet (150 mg total) by mouth daily. 30 tablet 5   No current facility-administered medications for this visit.     Objective: BP (!) 142/88 (BP Location: Left Arm, Cuff Size: Large)   Pulse 90   Temp (!) 97.4 F (36.3 C) (Oral)   Ht 5\' 7"  (1.702 m)    Wt 192 lb (87.1 kg)   LMP  (LMP Unknown)   SpO2 95%   BMI 30.07 kg/m  Gen: NAD, resting comfortably Tympanic membrane normal, oropharynx normal Neck exam normal-no thyromegaly or lymphadenopathy.  Some pain with deep palpation medial to right carotid artery- not pulsatile in this area. CV: RRR no murmurs rubs or gallops Lungs: CTAB no crackles, wheeze, rhonchi Ext: no edema Skin: warm, dry Neuro: Normal speech, PERRLA  Assessment/Plan:   Right neck pain S:  Right sided neck pain for 4-5 days. Either Friday or Saturday night- pain in anterior neck- had similar on left side sometime ago which eventually went away. 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   Hurt her all night long- felt a throbbing sensation in neck that night . Sometimes would hurt with jaw movement. By Saturday night and Sunday and into today was feeling better. Felt tender with palpation.  A/P: 62 year old woman with throbbing sensation in her neck several days ago that is improving.  Had almost pulsatile sensation so I suggested carotid imaging.  Since she is improving she would like to hold off on that-we discussed imaging if fails to continue to improve per below From AVS:  "  Glad the neck is improving- if it fails to continue to improve let me know and I can order the ultrasound of the carotids- if persisted beyond that could consider neck CT "  Elevated blood pressure reading S: Blood pressure poorly controlled on no medication.  She attributes this to a lot of stress- Has to buy a car and that has been weighting heavily on her. Timing belt going on car- doesn't want to spend 2k on old vehicle. Tough few weeks- sister with C. Diff and almost died- life support for almost a week- also had mild stroke BP Readings from Last 3 Encounters:  11/24/17 (!) 142/88  10/08/17 138/70  01/15/17 130/82  A/P: We discussed blood pressure goal of <140/90.   She is going to do some home monitoring-if blood pressure remains high which I am concerned it may as it seems to have been trending up- would likely start amlodipine.  We may do this through phone call or my chart if she signs up.   Overweight (BMI 25.0-29.9) S: patient with poor appetite control.  She typically exercises regularly with tennis.  She tries to eat a reasonably healthy diet but sometimes just has a very large appetite.  She admits to a lot of stress and this can also lead to excess eating A/P: She asks about trying Wellbutrin for appetite suppression.  She also struggles with stress management at times- I believe a trial of Wellbutrin over the next 3-6 months at 150 mg extended release would be very reasonable-send this in for her  Meds ordered this encounter  Medications  . buPROPion (WELLBUTRIN XL) 150 MG 24 hr tablet    Sig: Take 1 tablet (150 mg total) by mouth daily.    Dispense:  30 tablet    Refill:  5   Return precautions advised.  Garret Reddish, MD

## 2018-01-22 LAB — HM MAMMOGRAPHY

## 2018-02-08 ENCOUNTER — Encounter: Payer: Self-pay | Admitting: Family Medicine

## 2018-07-21 ENCOUNTER — Other Ambulatory Visit: Payer: Self-pay | Admitting: Family Medicine

## 2018-09-07 ENCOUNTER — Other Ambulatory Visit: Payer: Self-pay | Admitting: Family Medicine

## 2018-09-07 NOTE — Telephone Encounter (Signed)
Patient need to schedule an ov for more refills. Called pt no answer. Will try again later.

## 2018-09-07 NOTE — Telephone Encounter (Signed)
Last OV 11/2017.   Pt needs OV, please call to schedule. Rx refilled x 30 days.

## 2018-09-08 NOTE — Telephone Encounter (Signed)
I spoke with the patient and attempted to get her scheduled for a follow up medication refill appointment. She was busy at the time and understands she needs to call back to schedule in order to get more refills. No need to route note, for documentation purposes only.

## 2018-10-04 ENCOUNTER — Other Ambulatory Visit: Payer: Self-pay | Admitting: Family Medicine

## 2018-10-04 ENCOUNTER — Other Ambulatory Visit: Payer: Self-pay

## 2018-10-04 DIAGNOSIS — Z20822 Contact with and (suspected) exposure to covid-19: Secondary | ICD-10-CM

## 2018-10-05 ENCOUNTER — Encounter: Payer: Self-pay | Admitting: Family Medicine

## 2018-10-05 LAB — NOVEL CORONAVIRUS, NAA: SARS-CoV-2, NAA: DETECTED — AB

## 2018-10-06 ENCOUNTER — Encounter: Payer: Self-pay | Admitting: Family Medicine

## 2018-10-06 NOTE — Telephone Encounter (Signed)
This encounter was created in error - please disregard.

## 2018-10-06 NOTE — Telephone Encounter (Signed)
[name] from the [facility] stated that the patient had a positive COVID screening and the health department is aware. She said she was unable to route the note so I just composed this one to advise Dr. Ansel Bong team. No further action required.

## 2018-10-07 ENCOUNTER — Encounter: Payer: Self-pay | Admitting: Family Medicine

## 2018-10-07 ENCOUNTER — Ambulatory Visit (INDEPENDENT_AMBULATORY_CARE_PROVIDER_SITE_OTHER): Payer: PRIVATE HEALTH INSURANCE | Admitting: Family Medicine

## 2018-10-07 VITALS — Ht 67.0 in | Wt 181.0 lb

## 2018-10-07 DIAGNOSIS — U071 COVID-19: Secondary | ICD-10-CM | POA: Diagnosis not present

## 2018-10-07 MED ORDER — BUPROPION HCL ER (XL) 150 MG PO TB24: ORAL_TABLET | ORAL | 1 refills | Status: DC

## 2018-10-07 NOTE — Patient Instructions (Signed)
Health Maintenance Due  Topic Date Due  . Hepatitis C Screening  Nov 04, 1955  . HIV Screening  08/17/1970  . INFLUENZA VACCINE  08/07/2018   Depression screen PHQ 2/9 10/08/2017  Decreased Interest 0  Down, Depressed, Hopeless 0  PHQ - 2 Score 0

## 2018-10-07 NOTE — Telephone Encounter (Signed)
Pt scheduled for virtual visit today @4 :20.

## 2018-10-07 NOTE — Progress Notes (Signed)
Phone [phone]   Subjective:  Virtual visit via phonenote Chief Complaint  Patient presents with  . video visit  . positive corona results   This visit type was conducted due to national recommendations for restrictions regarding the COVID-19 Pandemic (e.g. social distancing).  This format is felt to be most appropriate for this patient at this time balancing risks to patient and risks to population by havingm them in for in person visit.  All issues noted in this document were discussed and addressed.  No physical exam was performed (except for noted visual exam or audio findings with Telehealth visits).  The patient has consented to conduct a Telehealth visit and understands insurance will be billed.   Our team/I connected with Marlyne Beards at  4:20 PM EDT by phone (patient did not have equipment for webex) and verified that I am speaking with the correct person using two identifiers.  Location patient: [place] Location provider: [facility], office Persons participating in the virtual visit:  patient  Time on phone: 22 minutes Counseling provided about covid-19, self-isolation  Our team/I discussed the limitations of evaluation and management by telemedicine and the availability of in person appointments. In light of current covid-19 pandemic, patient also understands that we are trying to protect them by minimizing in office contact if at all possible.  The patient expressed consent for telemedicine visit and agreed to proceed. Patient understands insurance will be billed.   ROS-no chest pain/shortness of breath.  Does have fatigue, congestion, sore throat.  No known contacts with covid 19 or someone being tested for covid 19.    Past Medical History-  Patient Active Problem List   Diagnosis Date Noted  . Elevated blood pressure reading 11/24/2017    Priority: Medium  . Hyperlipidemia 08/12/2013    Priority: Medium  . GERD (gastroesophageal reflux disease) 10/10/2017   Priority: Low  . Arthritis of left ankle 08/12/2013    Priority: Low  . Overweight (BMI 25.0-29.9) 11/24/2017    Medications- reviewed and updated Current Outpatient Medications  Medication Sig Dispense Refill  . Ascorbic Acid (VITAMIN C) 1000 MG tablet Take 1,000 mg by mouth daily.    Marland Kitchen buPROPion (WELLBUTRIN XL) 150 MG 24 hr tablet TAKE ONE TABLET BY MOUTH DAILY 90 tablet 1  . Cholecalciferol (VITAMIN D) 2000 UNITS tablet Take 2,000 Units by mouth daily.    . Coenzyme Q10 (CO Q 10 PO) Take by mouth daily.    . diclofenac sodium (VOLTAREN) 1 % GEL Apply 2 g topically 4 (four) times daily as needed. 100 g 3  . ibuprofen (ADVIL,MOTRIN) 800 MG tablet Take 800 mg by mouth every 8 (eight) hours as needed.    Ernestine Conrad 3-6-9 Fatty Acids (OMEGA-3-6-9 PO) Take by mouth daily.    Marland Kitchen omeprazole (PRILOSEC) 20 MG capsule Take 1 capsule (20 mg total) by mouth daily. 90 capsule 1  . PINE BARK, PYCNOGENOL, PO Take by mouth daily.    . Red Yeast Rice Extract (RED YEAST RICE PO) Take by mouth daily.    Marland Kitchen triamcinolone cream (KENALOG) 0.1 % Apply 1 application topically 2 (two) times daily. For 7-10 days maximum 80 g 0  . TURMERIC PO Take by mouth daily.    Marland Kitchen UNABLE TO FIND Med Name: Mangosistine.  Takes 2 tablets daily     No current facility-administered medications for this visit.      Objective:  Ht 5\' 7"  (1.702 m)   Wt 181 lb (82.1 kg)  BMI 28.35 kg/m  self reported vitals  Nonlabored voice, normal speech  Patient does not have thermometer     Assessment and Plan   # Covid 71 S:Patient's partner came down with a cold like Thursday. Partner talked to doctor who thought it was just a cold. By Sunday night patient came down with same symptoms- irritation in the throat, so coughing up small amounts of mucus, head congestion, slight sore throat, fever, chills. Does not have thermometer at home but subjective fevers.   Patient went to [facility] hospital on Monday morning. Tuesday felt at least 40%  better but got results on Tuesday afternoon- she and her partner were both positive.  Had small gathering over weekend but let everyone know. Also let people know from tennis match last tuesday  A/P: Patient tested positive for COVID-19.  Therefore: - recommended patient watch closely for shortness of breath or confusion or worsening symptoms and if those occur he should contact us immediately-encouraged her to purchase pulse oximeter and seek care if persistently below 94% -recommended self isolation until negative test AND at least 24 hours fever free without fever reducing medications AND improvement in respiratory symptoms  - work note written (see letter)  Recommended follow up: PRN for acute issue  Meds ordered this encounter  Medications  . buPROPion (WELLBUTRIN XL) 150 MG 24 hr tablet    Sig: TAKE ONE TABLET BY MOUTH DAILY    Dispense:  90 tablet    Refill:  1   Return precautions advised.  Garret Reddish, MD

## 2018-10-08 ENCOUNTER — Encounter: Payer: Self-pay | Admitting: Family Medicine

## 2018-10-11 NOTE — Telephone Encounter (Signed)
See note  Copied from [place] [id number]. Topic: General - Call Back - No Documentation >> Oct 11, 2018  4:10 PM Richardo Priest, [place] wrote: Reason for CRM: Patient called in stating she is returning a call back from [place]. Stated she went ahead and finally got the letter. If anything else is needed please call patient back.

## 2018-11-12 ENCOUNTER — Other Ambulatory Visit: Payer: Self-pay

## 2018-11-12 DIAGNOSIS — Z20822 Contact with and (suspected) exposure to covid-19: Secondary | ICD-10-CM

## 2018-11-13 LAB — NOVEL CORONAVIRUS, NAA: SARS-CoV-2, NAA: NOT DETECTED

## 2019-02-18 ENCOUNTER — Ambulatory Visit: Payer: PRIVATE HEALTH INSURANCE | Attending: Internal Medicine

## 2019-02-18 DIAGNOSIS — Z20822 Contact with and (suspected) exposure to covid-19: Secondary | ICD-10-CM

## 2019-02-20 LAB — NOVEL CORONAVIRUS, NAA: SARS-CoV-2, NAA: NOT DETECTED

## 2019-04-29 ENCOUNTER — Ambulatory Visit (INDEPENDENT_AMBULATORY_CARE_PROVIDER_SITE_OTHER): Payer: PRIVATE HEALTH INSURANCE | Admitting: Family Medicine

## 2019-04-29 ENCOUNTER — Other Ambulatory Visit: Payer: Self-pay

## 2019-04-29 ENCOUNTER — Encounter: Payer: Self-pay | Admitting: Family Medicine

## 2019-04-29 VITALS — BP 140/62 | HR 65 | Temp 98.2°F | Ht 67.0 in | Wt 188.0 lb

## 2019-04-29 DIAGNOSIS — E785 Hyperlipidemia, unspecified: Secondary | ICD-10-CM

## 2019-04-29 DIAGNOSIS — Z0000 Encounter for general adult medical examination without abnormal findings: Secondary | ICD-10-CM

## 2019-04-29 DIAGNOSIS — R030 Elevated blood-pressure reading, without diagnosis of hypertension: Secondary | ICD-10-CM

## 2019-04-29 MED ORDER — BUPROPION HCL ER (XL) 300 MG PO TB24: ORAL_TABLET | ORAL | 1 refills | Status: DC

## 2019-04-29 NOTE — Patient Instructions (Addendum)
Schedule a lab visit at the check out desk within 2 weeks. Return for future fasting labs meaning nothing but water after midnight please. Ok to take your medications with water.   Your blood pressure trend concerns me. I would like for you to buy/use a home cuff to check at least daily for 2 weeks. Your goal is <140/90 on average.  Bring your home cuff and your log of blood pressures with you to next visit- for 2 weeks before that visit as well. With these first 2 weeks of readings- send me a copy on mychart  Omron series 3 is reasonably affordable and good- usually around $30.    Recommended follow up: Return in about 6 months (around 10/29/2019) for follow up- or sooner if needed.

## 2019-04-29 NOTE — Progress Notes (Signed)
Phone: [phone]   Subjective:  Patient presents today for their annual physical. Chief complaint-noted.   See problem oriented charting- Review of Systems  Constitutional: Negative for chills and fever.  HENT: Negative for ear pain, hearing loss and tinnitus.   Eyes: Negative for blurred vision, double vision and photophobia.  Respiratory: Negative for cough, shortness of breath and wheezing.   Cardiovascular: Negative for chest pain, palpitations and leg swelling.  Gastrointestinal: Positive for heartburn. Negative for nausea and vomiting.       Meds help with symptoms when taken   Genitourinary: Positive for urgency. Negative for dysuria and frequency.       History of Urgency   Musculoskeletal: Negative for back pain, joint pain and neck pain.  Skin: Negative for itching and rash.  Neurological: Negative for dizziness, weakness and headaches.  Endo/Heme/Allergies: Does not bruise/bleed easily.  Psychiatric/Behavioral: Negative for depression, memory loss and suicidal ideas. The patient does not have insomnia.   Does have strained glute on the right  The following were reviewed and entered/updated in epic: Past Medical History:  Diagnosis Date  . GERD (gastroesophageal reflux disease)    Patient Active Problem List   Diagnosis Date Noted  . Elevated blood pressure reading 11/24/2017    Priority: Medium  . Hyperlipidemia 08/12/2013    Priority: Medium  . GERD (gastroesophageal reflux disease) 10/10/2017    Priority: Low  . Arthritis of left ankle 08/12/2013    Priority: Low  . Overweight (BMI 25.0-29.9) 11/24/2017   Past Surgical History:  Procedure Laterality Date  . dental implants  1985  . [place]   left  . SHOULDER SURGERY  1991   right    Family History  Problem Relation Age of Onset  . Hypertension Mother   . Hyperlipidemia Mother   . Heart disease Mother   . Heart attack Mother   . Heart disease Father   . Hypertension Father   . Heart  failure Father   . Heart attack Father   . Diabetes Brother   . Cancer Brother        kidney  . Heart disease Brother   . Colon cancer Neg Hx   . Stomach cancer Neg Hx     Medications- reviewed and updated Current Outpatient Medications  Medication Sig Dispense Refill  . Ascorbic Acid (VITAMIN C) 1000 MG tablet Take 1,000 mg by mouth daily.    Marland Kitchen buPROPion (WELLBUTRIN XL) 300 MG 24 hr tablet TAKE ONE TABLET BY MOUTH DAILY 90 tablet 1  . Cholecalciferol (VITAMIN D) 2000 UNITS tablet Take 2,000 Units by mouth daily.    . Coenzyme Q10 (CO Q 10 PO) Take by mouth daily.    . diclofenac sodium (VOLTAREN) 1 % GEL Apply 2 g topically 4 (four) times daily as needed. 100 g 3  . ibuprofen (ADVIL,MOTRIN) 800 MG tablet Take 800 mg by mouth every 8 (eight) hours as needed.    Ernestine Conrad 3-6-9 Fatty Acids (OMEGA-3-6-9 PO) Take by mouth daily.    Marland Kitchen omeprazole (PRILOSEC) 20 MG capsule Take 1 capsule (20 mg total) by mouth daily. 90 capsule 1  . PINE BARK, PYCNOGENOL, PO Take by mouth daily.    . Red Yeast Rice Extract (RED YEAST RICE PO) Take by mouth daily.    Marland Kitchen triamcinolone cream (KENALOG) 0.1 % Apply 1 application topically 2 (two) times daily. For 7-10 days maximum 80 g 0  . TURMERIC PO Take by mouth daily.    Marland Kitchen  UNABLE TO FIND Med Name: Mangosistine.  Takes 2 tablets daily     No current facility-administered medications for this visit.    Allergies-reviewed and updated Allergies  Allergen Reactions  . Meperidine Hcl Nausea And Vomiting  . Penicillins     Per pt: unknown    Social History   Social History Narrative  . Not on file   Objective  Objective:  BP 140/62   Pulse 65   Temp 98.2 F (36.8 C) (Temporal)   Ht 5\' 7"  (1.702 m)   Wt 188 lb (85.3 kg)   SpO2 98%   BMI 29.44 kg/m  Gen: NAD, resting comfortably HEENT: Mucous membranes are moist. Oropharynx normal Neck: no thyromegaly CV: RRR no murmurs rubs or gallops Lungs: CTAB no crackles, wheeze, rhonchi Abdomen:  soft/nontender/nondistended/normal bowel sounds. No rebound or guarding.  Ext: no edema Skin: warm, dry Neuro: grossly normal, moves all extremities, PERRLA   Assessment and Plan   64 y.o. female presenting for annual physical.  Health Maintenance counseling: 1. Anticipatory guidance: Patient counseled regarding regular dental exams q6 months, eye exams last one in last year,  avoiding smoking and second hand smoke , limiting alcohol to 1 beverage per day-. 3 days a  week 2-3 drinks a night-ideally should limit to 7 a week-in addition this would help with weight loss 2. Risk factor reduction:  Advised patient of need for regular exercise and diet rich and fruits and vegetables to reduce risk of heart attack and stroke. Exercise-4-5 times a week for 1.5 hrs to 2.5 hrs . Diet- Tries to eat healthy - down 4 lbs from last in person visit- down 8 lbs on home scales. Hard time to eat regularly with her schedule. Doesn't use smart phone regularly- wouldn't want to try myfitnesspal. Doesn't like fish. Likes carbs more than meats which is hard for her. Taking welbutrin but not as effective as it once was. Try higher dose for appetite suppression effect Wt Readings from Last 3 Encounters:  04/29/19 188 lb (85.3 kg)  10/07/18 181 lb (82.1 kg)  11/24/17 192 lb (87.1 kg)  3. Immunizations/screenings/ancillary studies- could get shingrix vaccine - declines for now Immunization History  Administered Date(s) Administered  . PFIZER SARS-COV-2 Vaccination 04/01/2019, 04/22/2019  . Td 09/02/2007  . Tdap 10/08/2017   4. Cervical cancer screening-  next due 12/21- shed like to go ahead and do this- so will call to schedule- technically had negative HPV but certainly resonable to do at 3 years this year 5. Breast cancer screening-  breast exam - sparing self exams- and mammogram 01/22/2018- wants to line this up with pap smear  6. Colon cancer screening -  next due 10/02/2021 with 10 year repeat planned 7. Skin  cancer screening- no dermatologist. advised regular sunscreen use. Denies worrisome, changing, or new skin lesions. Never seen by dermatology. Does not wear sun screen.  8. Birth control/STD check- monogomous and postmenopausal- declines at this time.  9. Osteoporosis screening at 68- will plan on this  -never smoker  Status of chronic or acute concerns   #Elevated blood pressure reading S: medication: not on bp meds- has been on ibuprofen for 2-3 weeks for strained glute Home readings #s: Had to do some home monitoring as she was looking at being a candidate to donate a kidney to her sister 120s-130s with controlled diastolic.  BP Readings from Last 3 Encounters:  04/29/19 140/62  11/24/17 (!) 142/88  10/08/17 138/70  A/P: Poor control in office-she  also has recently been on a lot of ibuprofen.  She is going to do some monitoring when she comes back off of ibuprofen at home and let me know by my chart.  We discussed possibly using amlodipine 2.5 mg before bed  #hyperlipidemia S: Medication: None No results found for: CHOL, HDL, LDLCALC, LDLDIRECT, TRIG, CHOLHDL A/P: Hyperlipidemia on problem list but she has not recently been checked-she will come back for fasting labs  Strain glute on the right-offered sports medicine referral if not improving.  #Overweight-Wellbutrin in the past has been helpful for appetite suppression-she would like to try 300 mg as 150 mg recently has not been as helpful.  See above discussion  Recommended follow up: Return in about 6 months (around 10/29/2019) for follow up- or sooner if needed.  Recommended physical in a year as well but she is not ready to schedule this Future Appointments  Date Time Provider [place]  05/12/2019  4:00 PM [facility]    Lab/Order associations: not  fasting-will return for fasting labs   ICD-10-CM   1. Preventative health care  Z00.00 Comprehensive metabolic panel    CBC with Differential/Platelet     Lipid panel    POCT Urinalysis Dipstick (Automated)  2. Hyperlipidemia, unspecified hyperlipidemia type  E78.5 Comprehensive metabolic panel    CBC with Differential/Platelet    Lipid panel    POCT Urinalysis Dipstick (Automated)  3. Elevated blood pressure reading  R03.0     Meds ordered this encounter  Medications  . buPROPion (WELLBUTRIN XL) 300 MG 24 hr tablet    Sig: TAKE ONE TABLET BY MOUTH DAILY    Dispense:  90 tablet    Refill:  1    Return precautions advised.  Garret Reddish, MD

## 2019-05-12 ENCOUNTER — Other Ambulatory Visit (INDEPENDENT_AMBULATORY_CARE_PROVIDER_SITE_OTHER): Payer: PRIVATE HEALTH INSURANCE

## 2019-05-12 ENCOUNTER — Other Ambulatory Visit: Payer: Self-pay

## 2019-05-12 DIAGNOSIS — E785 Hyperlipidemia, unspecified: Secondary | ICD-10-CM | POA: Diagnosis not present

## 2019-05-12 DIAGNOSIS — Z0000 Encounter for general adult medical examination without abnormal findings: Secondary | ICD-10-CM

## 2019-05-12 LAB — CBC WITH DIFFERENTIAL/PLATELET
Basophils Absolute: 0.1 10*3/uL (ref 0.0–0.1)
Basophils Relative: 1.2 % (ref 0.0–3.0)
Eosinophils Absolute: 0.2 10*3/uL (ref 0.0–0.7)
Eosinophils Relative: 4.2 % (ref 0.0–5.0)
HCT: 38 % (ref 36.0–46.0)
Hemoglobin: 13 g/dL (ref 12.0–15.0)
Lymphocytes Relative: 39.5 % (ref 12.0–46.0)
Lymphs Abs: 1.9 10*3/uL (ref 0.7–4.0)
MCHC: 34.1 g/dL (ref 30.0–36.0)
MCV: 93.6 fl (ref 78.0–100.0)
Monocytes Absolute: 0.3 10*3/uL (ref 0.1–1.0)
Monocytes Relative: 7 % (ref 3.0–12.0)
Neutro Abs: 2.3 10*3/uL (ref 1.4–7.7)
Neutrophils Relative %: 48.1 % (ref 43.0–77.0)
Platelets: 198 10*3/uL (ref 150.0–400.0)
RBC: 4.06 Mil/uL (ref 3.87–5.11)
RDW: 12.4 % (ref 11.5–15.5)
WBC: 4.8 10*3/uL (ref 4.0–10.5)

## 2019-05-12 LAB — COMPREHENSIVE METABOLIC PANEL
ALT: 32 U/L (ref 0–35)
AST: 31 U/L (ref 0–37)
Albumin: 4.3 g/dL (ref 3.5–5.2)
Alkaline Phosphatase: 71 U/L (ref 39–117)
BUN: 18 mg/dL (ref 6–23)
CO2: 27 mEq/L (ref 19–32)
Calcium: 9.2 mg/dL (ref 8.4–10.5)
Chloride: 102 mEq/L (ref 96–112)
Creatinine, Ser: 0.75 mg/dL (ref 0.40–1.20)
GFR: 77.86 mL/min (ref >60.00)
Glucose, Bld: 85 mg/dL (ref 70–99)
Potassium: 4 mEq/L (ref 3.5–5.1)
Sodium: 136 mEq/L (ref 135–145)
Total Bilirubin: 0.5 mg/dL (ref 0.2–1.2)
Total Protein: 6.8 g/dL (ref 6.0–8.3)

## 2019-05-12 LAB — LIPID PANEL
Cholesterol: 228 mg/dL — ABNORMAL HIGH (ref 0–200)
HDL: 56.8 mg/dL (ref >39.00)
LDL Cholesterol: 139 mg/dL — ABNORMAL HIGH (ref 0–99)
NonHDL: 171.38
Total CHOL/HDL Ratio: 4
Triglycerides: 161 mg/dL — ABNORMAL HIGH (ref 0.0–149.0)
VLDL: 32.2 mg/dL (ref 0.0–40.0)

## 2019-05-12 LAB — POC URINALSYSI DIPSTICK (AUTOMATED)
Bilirubin, UA: NEGATIVE
Blood, UA: NEGATIVE
Glucose, UA: NEGATIVE
Ketones, UA: NEGATIVE
Leukocytes, UA: NEGATIVE
Nitrite, UA: NEGATIVE
Protein, UA: NEGATIVE
Spec Grav, UA: 1.015 (ref 1.010–1.025)
Urobilinogen, UA: 0.2 E.U./dL
pH, UA: 7.5 (ref 5.0–8.0)

## 2019-10-23 ENCOUNTER — Other Ambulatory Visit: Payer: Self-pay | Admitting: Family Medicine

## 2020-02-23 ENCOUNTER — Ambulatory Visit: Payer: PRIVATE HEALTH INSURANCE | Admitting: Family Medicine

## 2020-04-19 ENCOUNTER — Other Ambulatory Visit: Payer: Self-pay | Admitting: Family Medicine

## 2020-05-24 ENCOUNTER — Other Ambulatory Visit: Payer: Self-pay

## 2020-05-24 ENCOUNTER — Encounter: Payer: Self-pay | Admitting: Family Medicine

## 2020-05-24 ENCOUNTER — Ambulatory Visit (INDEPENDENT_AMBULATORY_CARE_PROVIDER_SITE_OTHER): Payer: PRIVATE HEALTH INSURANCE | Admitting: Family Medicine

## 2020-05-24 VITALS — BP 150/86 | HR 69 | Temp 98.2°F | Ht 67.0 in | Wt 179.6 lb

## 2020-05-24 DIAGNOSIS — Z1283 Encounter for screening for malignant neoplasm of skin: Secondary | ICD-10-CM | POA: Diagnosis not present

## 2020-05-24 DIAGNOSIS — Z0000 Encounter for general adult medical examination without abnormal findings: Secondary | ICD-10-CM | POA: Diagnosis not present

## 2020-05-24 DIAGNOSIS — E785 Hyperlipidemia, unspecified: Secondary | ICD-10-CM

## 2020-05-24 DIAGNOSIS — I10 Essential (primary) hypertension: Secondary | ICD-10-CM

## 2020-05-24 MED ORDER — AMLODIPINE BESYLATE 2.5 MG PO TABS
2.5000 mg | ORAL_TABLET | Freq: Every day | ORAL | 5 refills | Status: DC
Start: 2020-05-24 — End: 2020-11-19

## 2020-05-24 NOTE — Progress Notes (Addendum)
Phone [phone]   Subjective:  Patient presents today for their annual physical. Chief complaint-noted.   See problem oriented charting- ROS- full  review of systems was completed and negative except for: cough- states throat is sensitive from bad reflux over years- can take time to clear- some worsening cough with covid in early may, color change, sleep distrubance  The following were reviewed and entered/updated in epic: Past Medical History:  Diagnosis Date  . GERD (gastroesophageal reflux disease)    Patient Active Problem List   Diagnosis Date Noted  . Essential hypertension 11/24/2017    Priority: Medium  . Hyperlipidemia 08/12/2013    Priority: Medium  . GERD (gastroesophageal reflux disease) 10/10/2017    Priority: Low  . Arthritis of left ankle 08/12/2013    Priority: Low  . Overweight (BMI 25.0-29.9) 11/24/2017   Past Surgical History:  Procedure Laterality Date  . dental implants  1985  . [place]   left  . SHOULDER SURGERY  1991   right    Family History  Problem Relation Age of Onset  . Hypertension Mother   . Hyperlipidemia Mother   . Heart disease Mother   . Heart attack Mother   . Heart disease Father   . Hypertension Father   . Heart failure Father   . Heart attack Father   . Diabetes Brother   . Cancer Brother        kidney  . Heart disease Brother        died at 1   . Diabetes Sister   . CAD Sister        minor heart attack in her 79s  . Kidney disease Sister        required transplant after severe c diff  . Other Brother        back issues  . CAD Brother        age 47  . Colon cancer Neg Hx   . Stomach cancer Neg Hx     Medications- reviewed and updated Current Outpatient Medications  Medication Sig Dispense Refill  . amLODipine (NORVASC) 2.5 MG tablet Take 1 tablet (2.5 mg total) by mouth daily. 30 tablet 5  . Ascorbic Acid (VITAMIN C) 1000 MG tablet Take 1,000 mg by mouth daily.    Marland Kitchen buPROPion (WELLBUTRIN XL)  300 MG 24 hr tablet TAKE ONE TABLET BY MOUTH DAILY 90 tablet 1  . Cholecalciferol (VITAMIN D) 2000 UNITS tablet Take 2,000 Units by mouth daily.    . Coenzyme Q10 (CO Q 10 PO) Take by mouth daily.    . diclofenac sodium (VOLTAREN) 1 % GEL Apply 2 g topically 4 (four) times daily as needed. 100 g 3  . Omega 3-6-9 Fatty Acids (OMEGA-3-6-9 PO) Take by mouth daily.    Marland Kitchen omeprazole (PRILOSEC) 20 MG capsule Take 1 capsule (20 mg total) by mouth daily. 90 capsule 1  . PINE BARK, PYCNOGENOL, PO Take by mouth daily.    . Red Yeast Rice Extract (RED YEAST RICE PO) Take by mouth daily.    Marland Kitchen UNABLE TO FIND Med Name: Mangosistine.  Takes 2 tablets daily    . ibuprofen (ADVIL,MOTRIN) 800 MG tablet Take 800 mg by mouth every 8 (eight) hours as needed. (Patient not taking: Reported on 05/24/2020)    . TURMERIC PO Take by mouth daily. (Patient not taking: Reported on 05/24/2020)     No current facility-administered medications for this visit.    Allergies-reviewed and updated Allergies  Allergen Reactions  . Meperidine Hcl Nausea And Vomiting  . Penicillins     Per pt: unknown    Social History   Social History Narrative  . Not on file   Objective  Objective:  BP (!) 150/86   Pulse 69   Temp 98.2 F (36.8 C) (Temporal)   Ht 5\' 7"  (1.702 m)   Wt 179 lb 9.6 oz (81.5 kg)   SpO2 96%   BMI 28.13 kg/m  Gen: NAD, resting comfortably HEENT: Mucous membranes are moist. Oropharynx normal Neck: no thyromegaly CV: RRR no murmurs rubs or gallops Lungs: CTAB no crackles, wheeze, rhonchi Abdomen: soft/nontender/nondistended/normal bowel sounds. No rebound or guarding.  Ext: minimal edema Skin: warm, dry Neuro: grossly normal, moves all extremities, PERRLA   Assessment and Plan   65 y.o. female presenting for annual physical.  Health Maintenance counseling: 1. Anticipatory guidance: Patient counseled regarding regular dental exams -q6 months, eye exams - every 2 years- no issues,  avoiding smoking  and second hand smoke , limiting alcohol to 1 beverage per day- sometimes slightly more- encouraged limiting .   2. Risk factor reduction:  Advised patient of need for regular exercise and diet rich and fruits and vegetables to reduce risk of heart attack and stroke. Exercise- typically excellent - continues tennis on regular basis. Diet-down 9 lbs from last year- wellbutrin helping with appetite suppression at higher dose. .  Wt Readings from Last 3 Encounters:  05/24/20 179 lb 9.6 oz (81.5 kg)  04/29/19 188 lb (85.3 kg)  10/07/18 181 lb (82.1 kg)  3. Immunizations/screenings/ancillary studies- natural immunity from covid earlier this month- would hold off on 4th immunization minimum of 6 months Immunization History  Administered Date(s) Administered  . PFIZER(Purple Top)SARS-COV-2 Vaccination 04/01/2019, 04/22/2019, 10/16/2019  . Td 09/02/2007  . Tdap 10/08/2017   4. Cervical cancer screening- 12/29/2016 pap smear- will sign ROI for next from [place]. Breast cancer screening-  breast exam with GYN and mammogram - will get through GYN office- sign roi 6. Colon cancer screening - 10/03/2011 with 10 year repeat- due next year 7. Skin cancer screening- no dermatologist- we will refer her for screening- has a spot on right leg for a few months slightly growing. Doesn't itch, not painful. Possible AK but larger than typical ones I have seen. Doesn't look like ringworm. advised regular sunscreen use- not the best at it.  1. Birth control/STD check- monogamous and postmenopausal. 9. Osteoporosis screening at 80- discussed- declines for now -Never smoker  Status of chronic or acute concerns   #social update- brother died from heart disease recently in his 45s.   #Snoring- wife has noted some snoring. No excessive daytime sleepiness Stop bang score of 2 is low risk for sleep apnea  #hypertension- see separate problem oriented note  #hyperlipidemia S: Medication:none  Lab Results  Component Value  Date   CHOL 228 (H) 05/12/2019   HDL 56.80 05/12/2019   LDLCALC 139 (H) 05/12/2019   TRIG 161.0 (H) 05/12/2019   CHOLHDL 4 05/12/2019   A/P: 10-year risk of heart attack or stroke at 9.7% based on last year's cholesterol and current blood pressure.  We discussed potential coronary calcium scoring test particularly in light of family member history in regards to CAD -she opts in   #overweight- Higher dose of Wellbutrin has been helpful for her for appetite suppression  Recommended follow up: Return in about 2 months (around 07/24/2020) for follow up- or sooner if needed.  Lab/Order associations: NOT fasting  ICD-10-CM   1. Preventative health care  Z00.00 CBC with Differential/Platelet    Comprehensive metabolic panel    Lipid panel  2. Hyperlipidemia, unspecified hyperlipidemia type  E78.5 CBC with Differential/Platelet    Comprehensive metabolic panel    Lipid panel    CT CARDIAC SCORING (SELF PAY ONLY)  3. Skin cancer screening  Z12.83 Ambulatory referral to Dermatology    Meds ordered this encounter  Medications  . amLODipine (NORVASC) 2.5 MG tablet    Sig: Take 1 tablet (2.5 mg total) by mouth daily.    Dispense:  30 tablet    Refill:  5    Return precautions advised.  Garret Reddish, MD

## 2020-05-24 NOTE — Progress Notes (Signed)
Phone [phone] In person visit   Subjective:   Michelle Howell is a 65 y.o. year old very pleasant female patient who presents for/with See problem oriented charting  This visit occurred during the SARS-CoV-2 public health emergency.  Safety protocols were in place, including screening questions prior to the visit, additional usage of staff PPE, and extensive cleaning of exam room while observing appropriate contact time as indicated for disinfecting solutions.   Past Medical History-  Patient Active Problem List   Diagnosis Date Noted  . Essential hypertension 11/24/2017    Priority: Medium  . Hyperlipidemia 08/12/2013    Priority: Medium  . GERD (gastroesophageal reflux disease) 10/10/2017    Priority: Low  . Arthritis of left ankle 08/12/2013    Priority: Low  . Overweight (BMI 25.0-29.9) 11/24/2017    Medications- reviewed and updated Current Outpatient Medications  Medication Sig Dispense Refill  . amLODipine (NORVASC) 2.5 MG tablet Take 1 tablet (2.5 mg total) by mouth daily. 30 tablet 5  . Ascorbic Acid (VITAMIN C) 1000 MG tablet Take 1,000 mg by mouth daily.    Marland Kitchen buPROPion (WELLBUTRIN XL) 300 MG 24 hr tablet TAKE ONE TABLET BY MOUTH DAILY 90 tablet 1  . Cholecalciferol (VITAMIN D) 2000 UNITS tablet Take 2,000 Units by mouth daily.    . Coenzyme Q10 (CO Q 10 PO) Take by mouth daily.    . diclofenac sodium (VOLTAREN) 1 % GEL Apply 2 g topically 4 (four) times daily as needed. 100 g 3  . Omega 3-6-9 Fatty Acids (OMEGA-3-6-9 PO) Take by mouth daily.    Marland Kitchen omeprazole (PRILOSEC) 20 MG capsule Take 1 capsule (20 mg total) by mouth daily. 90 capsule 1  . PINE BARK, PYCNOGENOL, PO Take by mouth daily.    . Red Yeast Rice Extract (RED YEAST RICE PO) Take by mouth daily.    Marland Kitchen UNABLE TO FIND Med Name: Mangosistine.  Takes 2 tablets daily    . ibuprofen (ADVIL,MOTRIN) 800 MG tablet Take 800 mg by mouth every 8 (eight) hours as needed. (Patient not taking: Reported on 05/24/2020)     . TURMERIC PO Take by mouth daily. (Patient not taking: Reported on 05/24/2020)     No current facility-administered medications for this visit.     Objective:  BP (!) 150/86   Pulse 69   Temp 98.2 F (36.8 C) (Temporal)   Ht 5\' 7"  (1.702 m)   Wt 179 lb 9.6 oz (81.5 kg)   SpO2 96%   BMI 28.13 kg/m  Gen: NAD, resting comfortably BP recheck was 150/86- initial reading with higher systolic by nursing    Assessment and Plan   Essential hypertension S: medication: None Home readings #s: average 145 when checks at the store.  BP Readings from Last 3 Encounters:  05/24/20 (!) 158/90  04/29/19 140/62  11/24/17 (!) 142/88  A/P: New diagnosis of hypertension with the last 3 systolic readings being elevated.  She also confirms some high readings at home- we are going to start amlodipine 2.5 mg and have her follow-up in 1 to 2 months in office.  If she does purchase a home cuff I would also be open to her messaging me in 2-3 weeks with home readings   Recommended follow up: Return in about 2 months (around 07/24/2020) for follow up- or sooner if needed.  Lab/Order associations:   ICD-10-CM   1. Essential hypertension  I10     Meds ordered this encounter  Medications  . amLODipine (  NORVASC) 2.5 MG tablet    Sig: Take 1 tablet (2.5 mg total) by mouth daily.    Dispense:  30 tablet    Refill:  5   Return precautions advised.  Garret Reddish, MD

## 2020-05-24 NOTE — Assessment & Plan Note (Signed)
S: medication: None Home readings #s: average 145 when checks at the store.  BP Readings from Last 3 Encounters:  05/24/20 (!) 158/90  04/29/19 140/62  11/24/17 (!) 142/88  A/P: New diagnosis of hypertension with the last 3 systolic readings being elevated.  She also confirms some high readings at home- we are going to start amlodipine 2.5 mg and have her follow-up in 1 to 2 months in office.  If she does purchase a home cuff I would also be open to her messaging me in 2-3 weeks with home readings

## 2020-05-24 NOTE — Patient Instructions (Addendum)
Health Maintenance Due  Topic Date Due  . PAP SMEAR-Modifier Sign release form at checkout.  12/30/2019  . MAMMOGRAM Sign release form at checkout.  01/23/2020   We will call you within two weeks about your referral to dermatology. If you do not hear within 2 weeks, give [place] a call.   Stop bang score of 2 is low risk for sleep apnea  New diagnosis of hypertension with the last 3 systolic readings being elevated.  She also confirms some high readings at home- we are going to start amlodipine 2.5 mg and have her follow-up in 1 to 2 months in office.  If she does purchase a home cuff I would also be open to her messaging me in 2-3 weeks with home readings  Recommended follow up: Return in about 2 months (around 07/24/2020) for follow up- or sooner if needed.

## 2020-05-25 LAB — CBC WITH DIFFERENTIAL/PLATELET
Basophils Absolute: 0 10*3/uL (ref 0.0–0.2)
Basos: 1 %
EOS (ABSOLUTE): 0.1 10*3/uL (ref 0.0–0.4)
Eos: 2 %
Hematocrit: 40.9 % (ref 34.0–46.6)
Hemoglobin: 14.1 g/dL (ref 11.1–15.9)
Immature Grans (Abs): 0 10*3/uL (ref 0.0–0.1)
Immature Granulocytes: 0 %
Lymphocytes Absolute: 1.8 10*3/uL (ref 0.7–3.1)
Lymphs: 36 %
MCH: 30.6 pg (ref 26.6–33.0)
MCHC: 34.5 g/dL (ref 31.5–35.7)
MCV: 89 fL (ref 79–97)
Monocytes Absolute: 0.4 10*3/uL (ref 0.1–0.9)
Monocytes: 8 %
Neutrophils Absolute: 2.6 10*3/uL (ref 1.4–7.0)
Neutrophils: 53 %
Platelets: 307 10*3/uL (ref 150–450)
RBC: 4.61 x10E6/uL (ref 3.77–5.28)
RDW: 11.9 % (ref 11.7–15.4)
WBC: 4.9 10*3/uL (ref 3.4–10.8)

## 2020-05-25 LAB — COMPREHENSIVE METABOLIC PANEL
ALT: 25 IU/L (ref 0–32)
AST: 24 IU/L (ref 0–40)
Albumin/Globulin Ratio: 2 (ref 1.2–2.2)
Albumin: 4.9 g/dL — ABNORMAL HIGH (ref 3.8–4.8)
Alkaline Phosphatase: 96 IU/L (ref 44–121)
BUN/Creatinine Ratio: 19 (ref 12–28)
BUN: 14 mg/dL (ref 8–27)
Bilirubin Total: 0.4 mg/dL (ref 0.0–1.2)
CO2: 23 mmol/L (ref 20–29)
Calcium: 9.8 mg/dL (ref 8.7–10.3)
Chloride: 98 mmol/L (ref 96–106)
Creatinine, Ser: 0.73 mg/dL (ref 0.57–1.00)
Globulin, Total: 2.4 g/dL (ref 1.5–4.5)
Glucose: 80 mg/dL (ref 65–99)
Potassium: 4.5 mmol/L (ref 3.5–5.2)
Sodium: 138 mmol/L (ref 134–144)
Total Protein: 7.3 g/dL (ref 6.0–8.5)
eGFR: 92 mL/min/{1.73_m2} (ref >59)

## 2020-05-25 LAB — LIPID PANEL
Chol/HDL Ratio: 4.6 ratio — ABNORMAL HIGH (ref 0.0–4.4)
Cholesterol, Total: 244 mg/dL — ABNORMAL HIGH (ref 100–199)
HDL: 53 mg/dL (ref >39)
LDL Chol Calc (NIH): 164 mg/dL — ABNORMAL HIGH (ref 0–99)
Triglycerides: 151 mg/dL — ABNORMAL HIGH (ref 0–149)
VLDL Cholesterol Cal: 27 mg/dL (ref 5–40)

## 2020-07-13 ENCOUNTER — Ambulatory Visit (INDEPENDENT_AMBULATORY_CARE_PROVIDER_SITE_OTHER)
Admission: RE | Admit: 2020-07-13 | Discharge: 2020-07-13 | Disposition: A | Discharge status: Home / Self Care | Payer: Self-pay | Source: Ambulatory Visit | Attending: Family Medicine | Admitting: Family Medicine

## 2020-07-13 ENCOUNTER — Other Ambulatory Visit: Payer: Self-pay

## 2020-07-13 ENCOUNTER — Encounter: Payer: Self-pay | Admitting: Family Medicine

## 2020-07-13 DIAGNOSIS — I700 Atherosclerosis of aorta: Secondary | ICD-10-CM | POA: Insufficient documentation

## 2020-07-13 DIAGNOSIS — E785 Hyperlipidemia, unspecified: Secondary | ICD-10-CM

## 2020-08-03 ENCOUNTER — Other Ambulatory Visit: Payer: Self-pay

## 2020-08-03 ENCOUNTER — Encounter: Payer: Self-pay | Admitting: Family Medicine

## 2020-08-03 ENCOUNTER — Ambulatory Visit (INDEPENDENT_AMBULATORY_CARE_PROVIDER_SITE_OTHER): Payer: PRIVATE HEALTH INSURANCE | Admitting: Family Medicine

## 2020-08-03 VITALS — BP 138/86 | HR 82 | Temp 98.3°F | Ht 67.0 in | Wt 181.4 lb

## 2020-08-03 DIAGNOSIS — I10 Essential (primary) hypertension: Secondary | ICD-10-CM

## 2020-08-03 DIAGNOSIS — I700 Atherosclerosis of aorta: Secondary | ICD-10-CM | POA: Diagnosis not present

## 2020-08-03 DIAGNOSIS — E785 Hyperlipidemia, unspecified: Secondary | ICD-10-CM

## 2020-08-03 MED ORDER — ROSUVASTATIN CALCIUM 5 MG PO TABS: 5.0000 mg | ORAL_TABLET | Freq: Every day | ORAL | 11 refills | Status: DC

## 2020-08-03 NOTE — Progress Notes (Signed)
Phone [phone] In person visit   Subjective:   Michelle Howell is a 65 y.o. year old very pleasant female patient who presents for/with See problem oriented charting Chief Complaint  Patient presents with   Hypertension   This visit occurred during the SARS-CoV-2 public health emergency.  Safety protocols were in place, including screening questions prior to the visit, additional usage of staff PPE, and extensive cleaning of exam room while observing appropriate contact time as indicated for disinfecting solutions.   Past Medical History-  Patient Active Problem List   Diagnosis Date Noted   Aortic atherosclerosis ([place]) 07/13/2020    Priority: Medium   Essential hypertension 11/24/2017    Priority: Medium   Hyperlipidemia 08/12/2013    Priority: Medium   GERD (gastroesophageal reflux disease) 10/10/2017    Priority: Low   Arthritis of left ankle 08/12/2013    Priority: Low   Overweight (BMI 25.0-29.9) 11/24/2017    Medications- reviewed and updated Current Outpatient Medications  Medication Sig Dispense Refill   amLODipine (NORVASC) 2.5 MG tablet Take 1 tablet (2.5 mg total) by mouth daily. 30 tablet 5   Ascorbic Acid (VITAMIN C) 1000 MG tablet Take 1,000 mg by mouth daily.     buPROPion (WELLBUTRIN XL) 300 MG 24 hr tablet TAKE ONE TABLET BY MOUTH DAILY 90 tablet 1   Cholecalciferol (VITAMIN D) 2000 UNITS tablet Take 2,000 Units by mouth daily.     Coenzyme Q10 (CO Q 10 PO) Take by mouth daily.     diclofenac sodium (VOLTAREN) 1 % GEL Apply 2 g topically 4 (four) times daily as needed. 100 g 3   ibuprofen (ADVIL,MOTRIN) 800 MG tablet Take 800 mg by mouth every 8 (eight) hours as needed.     Omega 3-6-9 Fatty Acids (OMEGA-3-6-9 PO) Take by mouth daily.     omeprazole (PRILOSEC) 20 MG capsule Take 1 capsule (20 mg total) by mouth daily. 90 capsule 1   PINE BARK, PYCNOGENOL, PO Take by mouth daily.     Red Yeast Rice Extract (RED YEAST RICE PO) Take by mouth daily.      rosuvastatin (CRESTOR) 5 MG tablet Take 1 tablet (5 mg total) by mouth daily. 30 tablet 11   TURMERIC PO Take by mouth daily.     UNABLE TO FIND Med Name: Mangosistine.  Takes 1 tablet daily     No current facility-administered medications for this visit.     Objective:  BP 138/86 (BP Location: Left Arm, Patient Position: Sitting, Cuff Size: Normal)   Pulse 82   Temp 98.3 F (36.8 C) (Temporal)   Ht '5\' 7"'$  (1.702 m)   Wt 181 lb 6.1 oz (82.3 kg)   SpO2 96%   BMI 28.41 kg/m  Gen: NAD, resting comfortably CV: RRR no murmurs rubs or gallops Lungs: CTAB no crackles, wheeze, rhonchi Ext: no edema Skin: warm, dry     Assessment and Plan   #hypertension S: medication: Amlodipine 2.5 mg started May 24, 2020. No increased swelling Home readings #s: has not had a chance to check lately  -continuing to play tennis BP Readings from Last 3 Encounters:  08/03/20 138/86  05/24/20 (!) 150/86  04/29/19 140/62  A/P: Blood pressure Improved on medication.  We discussed possibly increasing to 5 mg dose to push for further lower numbers.  She has not had a chance to check at home recently and wants to start with this-we discussed a home goal of less than 135/85 which is more aggressive  than our in office goal of 140/90.  If she is above goal we can consider trying 5 mg dose -she is also a salt lover and can work on cutting back- see dash eating plan  #hyperlipidemia with coronary artery calcium score greater than 80 at 81st percentile for age #Aortic atherosclerosis S: Medication: red yeast rice, coq10 Lab Results  Component Value Date   CHOL 244 (H) 05/24/2020   HDL 53 05/24/2020   LDLCALC 164 (H) 05/24/2020   TRIG 151 (H) 05/24/2020   CHOLHDL 4.6 (H) 05/24/2020   A/P: Poor control of lipids especially in light of aortic atherosclerosis and coronary artery calcium score greater than 80-we discussed potential statin use-she has multiple concerns about this-we discussed potential benefits  versus risks of medication-she is concerned about diabetes risk with brother's history but also concerned about cardiac risk -We opted to start on a rather low end of the spectrum with rosuvastatin 5 mg daily-we can reevaluate at 9 months physical and attempt to titrate to get at least a 50% reduction in LDL that would prefer to get her under 70 if possible  # Neuropathy S:intermittent burningpain in her feet- can trigger hot flashes at times - has become more frequent over time almost nightly at this point. Also has back pain on both sides for years.   A/P: Patient with burning pain in the bottom of her feet primarily at night concerning for neuropathy.  Discussed checking B12 and TSH-we can do this at her 66-monthphysical as her lab was closed today at time of her visit.  Also discussed could empirically take B12-she does not eat the most meat  #right neck pain last noted 11/24/17- was improving at that time- declined carotid imaging at that time. Intermittent issues. Pain recurred in last week. Got a good massage at work yesterday- worked on [place] pain resolved after massage. We looked back and actually had similar pain 07/26/13 -sounds like origin of pain is now SCM- she can get massage if recurrent and let me know if not heplful  Recommended follow up: Return in about 9 months (around 05/04/2021) for physical or sooner if needed. Future Appointments  Date Time Provider DBethel 04/18/2021  3:40 PM HMarin Olp MD [facility]    Lab/Order associations: No diagnosis found.  Meds ordered this encounter  Medications   rosuvastatin (CRESTOR) 5 MG tablet    Sig: Take 1 tablet (5 mg total) by mouth daily.    Dispense:  30 tablet    Refill:  11    Return precautions advised.  SGarret Reddish MD

## 2020-08-03 NOTE — Patient Instructions (Addendum)
Health Maintenance Due  Topic Date Due   Zoster Vaccines- Shingrix (1 of 2)- declines for now Never done   PAP SMEAR- we will try again for copy if do not have by next visit 12/30/2019   COVID-19 Vaccine (4 - Booster for [employer] series) - had covid in may- reasonable to wait minimum 4-6 months before considering another vaccination 02/16/2020   Blood pressure Improved on medication.  We discussed possibly increasing to 5 mg dose to push for further lower numbers.  She has not had a chance to check at home recently and wants to start with this-we discussed a home goal of less than 135/85 which is more aggressive than our in office goal of 140/90.  If she is above goal we can consider trying 5 mg dose -she is also a salt lover and can work on cutting back- see dash eating plan  Start rosuvastatin 5 mg daily - let me know if you have any issues with this  Recommended follow up: Return in about 9 months (around 05/04/2021) for physical or sooner if needed.

## 2020-08-10 ENCOUNTER — Other Ambulatory Visit: Payer: Self-pay | Admitting: Family Medicine

## 2020-10-21 ENCOUNTER — Other Ambulatory Visit: Payer: Self-pay | Admitting: Family Medicine

## 2020-11-17 ENCOUNTER — Other Ambulatory Visit: Payer: Self-pay | Admitting: Family Medicine

## 2020-12-13 ENCOUNTER — Ambulatory Visit: Payer: PRIVATE HEALTH INSURANCE | Admitting: Physician Assistant

## 2021-04-18 ENCOUNTER — Ambulatory Visit: Payer: PRIVATE HEALTH INSURANCE | Admitting: Family Medicine

## 2021-04-22 ENCOUNTER — Other Ambulatory Visit: Payer: Self-pay | Admitting: Family Medicine

## 2021-05-30 ENCOUNTER — Ambulatory Visit (INDEPENDENT_AMBULATORY_CARE_PROVIDER_SITE_OTHER): Payer: PPO | Admitting: Family Medicine

## 2021-05-30 ENCOUNTER — Encounter: Payer: Self-pay | Admitting: Family Medicine

## 2021-05-30 VITALS — BP 130/80 | HR 77 | Temp 98.4°F | Ht 67.0 in | Wt 183.2 lb

## 2021-05-30 DIAGNOSIS — I10 Essential (primary) hypertension: Secondary | ICD-10-CM

## 2021-05-30 DIAGNOSIS — Z0000 Encounter for general adult medical examination without abnormal findings: Secondary | ICD-10-CM | POA: Diagnosis not present

## 2021-05-30 DIAGNOSIS — Z1283 Encounter for screening for malignant neoplasm of skin: Secondary | ICD-10-CM

## 2021-05-30 DIAGNOSIS — Z1211 Encounter for screening for malignant neoplasm of colon: Secondary | ICD-10-CM | POA: Diagnosis not present

## 2021-05-30 DIAGNOSIS — E785 Hyperlipidemia, unspecified: Secondary | ICD-10-CM

## 2021-05-30 DIAGNOSIS — I700 Atherosclerosis of aorta: Secondary | ICD-10-CM

## 2021-05-30 NOTE — Progress Notes (Signed)
Phone [phone]   Subjective:  Patient presents today for their annual physical. Chief complaint-noted.   See problem oriented charting- Review of Systems  Constitutional:  Negative for chills and fever.  HENT:  Negative for congestion and sinus pain.   Eyes:  Negative for blurred vision and double vision.  Respiratory:  Negative for cough and hemoptysis.   Cardiovascular:  Negative for chest pain and palpitations.  Gastrointestinal:  Positive for blood in stool (occasional mild amount- hemorrhoidal she thinks) and heartburn (occasional). Negative for abdominal pain, constipation, diarrhea, melena, nausea and vomiting.  Genitourinary:  Positive for frequency (stable). Negative for dysuria.  Musculoskeletal:  Positive for joint pain and myalgias.  Skin:  Negative for itching and rash.  Neurological:  Negative for dizziness and headaches.  Endo/Heme/Allergies:  Negative for polydipsia. Does not bruise/bleed easily.  Psychiatric/Behavioral:  Negative for depression and suicidal ideas.    The following were reviewed and entered/updated in epic: Past Medical History:  Diagnosis Date   GERD (gastroesophageal reflux disease)    Patient Active Problem List   Diagnosis Date Noted   Aortic atherosclerosis ([place]) 07/13/2020    Priority: Medium    Essential hypertension 11/24/2017    Priority: Medium    Hyperlipidemia 08/12/2013    Priority: Medium    GERD (gastroesophageal reflux disease) 10/10/2017    Priority: Low   Arthritis of left ankle 08/12/2013    Priority: Low   Overweight (BMI 25.0-29.9) 11/24/2017   Past Surgical History:  Procedure Laterality Date   dental implants  [place]   left   SHOULDER SURGERY  1991   right    Family History  Problem Relation Age of Onset   Hypertension Mother    Hyperlipidemia Mother    Heart disease Mother    Heart attack Mother    Heart disease Father    Hypertension Father    Heart failure Father    Heart  attack Father    Diabetes Brother    Cancer Brother        kidney   Heart disease Brother        died at 103    Diabetes Sister    CAD Sister        minor heart attack in her 39s   Kidney disease Sister        required transplant after severe c diff   Other Brother        back issues   CAD Brother        age 56   Colon cancer Neg Hx    Stomach cancer Neg Hx     Medications- reviewed and updated Current Outpatient Medications  Medication Sig Dispense Refill   amLODipine (NORVASC) 2.5 MG tablet TAKE ONE TABLET BY MOUTH DAILY 90 tablet 3   Ascorbic Acid (VITAMIN C) 1000 MG tablet Take 1,000 mg by mouth daily.     buPROPion (WELLBUTRIN XL) 300 MG 24 hr tablet TAKE ONE TABLET BY MOUTH DAILY 90 tablet 0   Cholecalciferol (VITAMIN D) 2000 UNITS tablet Take 2,000 Units by mouth daily.     Coenzyme Q10 (CO Q 10 PO) Take by mouth daily.     diclofenac sodium (VOLTAREN) 1 % GEL Apply 2 g topically 4 (four) times daily as needed. 100 g 3   ibuprofen (ADVIL,MOTRIN) 800 MG tablet Take 800 mg by mouth every 8 (eight) hours as needed.     Omega 3-6-9 Fatty Acids (OMEGA-3-6-9 PO) Take by  mouth daily.     omeprazole (PRILOSEC) 20 MG capsule Take 1 capsule (20 mg total) by mouth daily. 90 capsule 1   PINE BARK, PYCNOGENOL, PO Take by mouth daily.     Red Yeast Rice Extract (RED YEAST RICE PO) Take by mouth daily.     rosuvastatin (CRESTOR) 5 MG tablet Take 1 tablet (5 mg total) by mouth daily. 30 tablet 11   TURMERIC PO Take by mouth daily.     UNABLE TO FIND Med Name: Mangosistine.  Takes 1 tablet daily     No current facility-administered medications for this visit.    Allergies-reviewed and updated Allergies  Allergen Reactions   Meperidine Hcl Nausea And Vomiting   Penicillins     Per pt: unknown    Social History   Social History Narrative   Married      Geophysicist/field seismologist      Hobbies: tennis   Objective  Objective:  BP 130/80   Pulse 77   Temp 98.4 F (36.9 C)   Ht 5'  7" (1.702 m)   Wt 183 lb 3.2 oz (83.1 kg)   LMP  (LMP Unknown)   SpO2 98%   BMI 28.69 kg/m  Gen: NAD, resting comfortably HEENT: Mucous membranes are moist. Oropharynx normal Neck: no thyromegaly CV: RRR no murmurs rubs or gallops Lungs: CTAB no crackles, wheeze, rhonchi Abdomen: soft/nontender/nondistended/normal bowel sounds. No rebound or guarding.  Ext: no edema Skin: warm, dry Neuro: grossly normal, moves all extremities, PERRLA   Assessment and Plan   66 y.o. female presenting for annual physical.  Health Maintenance counseling: 1. Anticipatory guidance: Patient counseled regarding regular dental exams -q6 months, eye exams - advised yearly,  avoiding smoking and second hand smoke , limiting alcohol to 1 beverage per day , no illicit drugs .   2. Risk factor reduction:  Advised patient of need for regular exercise and diet rich and fruits and vegetables to reduce risk of heart attack and stroke.  Exercise- tennis 3-4 days a week well over an hour and yardwork.  Diet/weight management-weight down 5 lbs from last CPE- she thinks even better- staying around 178 on home scales- wellbutrin has bene helpful.  Wt Readings from Last 3 Encounters:  05/30/21 183 lb 3.2 oz (83.1 kg)  08/03/20 181 lb 6.1 oz (82.3 kg)  05/24/20 179 lb 9.6 oz (81.5 kg)  3. Immunizations/screenings/ancillary studies- considering bivalent in the fall. Prevnar 20 in future. Shingrix in future.  Immunization History  Administered Date(s) Administered   PFIZER(Purple Top)SARS-COV-2 Vaccination 04/01/2019, 04/22/2019, 10/16/2019   Td 09/02/2007   Tdap 10/08/2017  4. Cervical cancer screening- pap in 2018 and HPV negative - techically good for 5 years- would be due in December and will turn 66 by then- will be seeing GYN Dr. Philis Pique- they will decide 5. Breast cancer screening-  breast exam with GYN and mammogram 03/23/20- advised yearly 6. Colon cancer screening - 10/03/11 with 10 year repeat- due in the fall 7.  Skin cancer screening- wants referral to derm this year ago- last time was 10 months so will try [facility]. advised regular sunscreen use- not the best. Denies worrisome, changing, or new skin lesions other than one spot on left leg- possible [place] 8. Birth control/STD check- monogamous and postmenopausal 9. Osteoporosis screening at 66- wants to hold off on dexa- reports ortho told her bones were strong on prior MRI 10. Smoking associated screening - Never smoker  Status of chronic or acute concerns   #  hypertension S: medication: Amlodipine 2.5 mg started May 24, 2020. No increased swelling -continuing to play tennis  BP Readings from Last 3 Encounters:  05/30/21 130/80  08/03/20 138/86  05/24/20 (!) 150/86  A/P: Controlled. Continue current medications.   #hyperlipidemia with coronary artery calcium score greater than 80 at 81st percentile for age #Aortic atherosclerosis S: medication- rosuvastatin 5 mg daily- joint pain possibly worse- also red yeast rice prefers to continue Lab Results  Component Value Date   CHOL 244 (H) 05/24/2020   HDL 53 05/24/2020   LDLCALC 164 (H) 05/24/2020   TRIG 151 (H) 05/24/2020   CHOLHDL 4.6 (H) 05/24/2020  A/P: hopefully cholesterol improved- update lipid panel with labs -nonobstructive CAD- hoping LDL under 70- titrate dose (also goal with aortic atherosclerosis- hopefully stable)  # Neuropathy S:intermittent burningpain in her feet- can trigger hot flashes at times - has become more frequent over time almost nightly at this point. Also has back pain on both sides for years.  A/P: ongoing intermittent issue- at night - continue to monitor   #appetite management- wellbutrin 300 mg XR has been helpful  for maintaining weigh tin overweight range   # Right shoulder pain- may want referral to Dr. Griffin Basil [facility]  Recommended follow up: Return in about 6 months (around 11/30/2021) for followup or sooner if needed.Schedule b4 you  leave.  Lab/Order associations: coffee with protein drink, cheese nabz pack, banana, aussie bites   ICD-10-CM   1. Preventative health care  Z00.00     2. Encounter for screening colonoscopy  Z12.11 Ambulatory referral to Gastroenterology    3. Essential hypertension  I10     4. Hyperlipidemia, unspecified hyperlipidemia type  E78.5 CBC with Differential/Platelet    Comprehensive metabolic panel    Lipid panel    5. Aortic atherosclerosis (HCC)  I70.0     6. Skin cancer screening  Z12.83 Ambulatory referral to Dermatology     No orders of the defined types were placed in this encounter.  Return precautions advised.  Garret Reddish, MD

## 2021-05-30 NOTE — Patient Instructions (Addendum)
[facility] contact Please call to schedule visit and/or procedure Address: [address], [place], [place] [address] Phone: [phone]   Consider shingrix, prevnar 20, covid bivalent at pharmacy  Sign release of information at the check out desk for last pap smear from GYN  Please go to [facility] GO FASTING please- nothing but water after midnight - located [address] across the [address] from [place] - in the basement - Hours: 7:30-5:30 PM M-F. You do NOT need an appointment.  - Please ensure you are covid symptom free before going in(No fever, chills, cough, congestion, runny nose, shortness of breath, fatigue, body aches, sore throat, headache, nausea, vomiting, diarrhea, or new loss of taste or smell. No known contacts with covid 19 or someone being tested for covid 19)  Recommended follow up: Return in about 6 months (around 11/30/2021) for followup or sooner if needed.Schedule b4 you leave.

## 2021-06-13 DIAGNOSIS — E785 Hyperlipidemia, unspecified: Secondary | ICD-10-CM | POA: Diagnosis not present

## 2021-06-13 DIAGNOSIS — I10 Essential (primary) hypertension: Secondary | ICD-10-CM | POA: Diagnosis not present

## 2021-06-13 DIAGNOSIS — M1990 Unspecified osteoarthritis, unspecified site: Secondary | ICD-10-CM | POA: Diagnosis not present

## 2021-06-13 DIAGNOSIS — K219 Gastro-esophageal reflux disease without esophagitis: Secondary | ICD-10-CM | POA: Diagnosis not present

## 2021-06-13 DIAGNOSIS — R32 Unspecified urinary incontinence: Secondary | ICD-10-CM | POA: Diagnosis not present

## 2021-06-13 DIAGNOSIS — E663 Overweight: Secondary | ICD-10-CM | POA: Diagnosis not present

## 2021-07-18 ENCOUNTER — Other Ambulatory Visit: Payer: Self-pay | Admitting: Family Medicine

## 2021-08-02 ENCOUNTER — Other Ambulatory Visit: Payer: Self-pay | Admitting: Family Medicine

## 2021-08-14 ENCOUNTER — Telehealth: Payer: Self-pay | Admitting: Family Medicine

## 2021-08-14 NOTE — Telephone Encounter (Signed)
Copied from [place] [id number]. Topic: Medicare AWV >> Aug 14, 2021 12:53 PM Devoria Glassing wrote: Reason for CRM: Left message for patient to schedule Annual Wellness Visit.  Please schedule with Nurse Health Advisor Charlott Rakes, RN at [facility]. This appt can be telephone or office visit. Please call [phone] ask for [facility]

## 2021-09-30 ENCOUNTER — Encounter: Payer: Self-pay | Admitting: *Deleted

## 2021-10-13 ENCOUNTER — Other Ambulatory Visit: Payer: Self-pay | Admitting: Family Medicine

## 2021-10-22 ENCOUNTER — Encounter: Payer: Self-pay | Admitting: Internal Medicine

## 2021-10-31 ENCOUNTER — Telehealth: Payer: Self-pay | Admitting: Family Medicine

## 2021-10-31 NOTE — Telephone Encounter (Signed)
Copied from [place] [id number]. Topic: Medicare AWV >> Oct 31, 2021  9:12 AM Gillis Santa wrote: Reason for CRM:  LEFT VOICEMAIL FOR PT TO CALL [facility] CONTACT # [phone] TO [place]

## 2021-11-17 ENCOUNTER — Other Ambulatory Visit: Payer: Self-pay | Admitting: Family Medicine

## 2021-11-21 ENCOUNTER — Ambulatory Visit (AMBULATORY_SURGERY_CENTER): Payer: Self-pay

## 2021-11-21 VITALS — Ht 67.0 in | Wt 180.0 lb

## 2021-11-21 DIAGNOSIS — Z1211 Encounter for screening for malignant neoplasm of colon: Secondary | ICD-10-CM

## 2021-11-21 MED ORDER — NA SULFATE-K SULFATE-MG SULF 17.5-3.13-1.6 GM/177ML PO SOLN: 1.0000 | Freq: Once | ORAL | 0 refills | Status: AC

## 2021-11-21 NOTE — Progress Notes (Signed)

## 2021-11-22 ENCOUNTER — Ambulatory Visit (INDEPENDENT_AMBULATORY_CARE_PROVIDER_SITE_OTHER): Payer: PPO | Admitting: Family Medicine

## 2021-11-22 ENCOUNTER — Encounter: Payer: Self-pay | Admitting: Family Medicine

## 2021-11-22 VITALS — BP 137/83 | HR 83 | Temp 98.0°F | Ht 67.0 in | Wt 182.8 lb

## 2021-11-22 DIAGNOSIS — M25511 Pain in right shoulder: Secondary | ICD-10-CM | POA: Diagnosis not present

## 2021-11-22 DIAGNOSIS — I700 Atherosclerosis of aorta: Secondary | ICD-10-CM

## 2021-11-22 DIAGNOSIS — E785 Hyperlipidemia, unspecified: Secondary | ICD-10-CM | POA: Diagnosis not present

## 2021-11-22 DIAGNOSIS — I10 Essential (primary) hypertension: Secondary | ICD-10-CM | POA: Diagnosis not present

## 2021-11-22 DIAGNOSIS — G8929 Other chronic pain: Secondary | ICD-10-CM

## 2021-11-22 LAB — CBC WITH DIFFERENTIAL/PLATELET
Absolute Monocytes: 382 cells/uL (ref 200–950)
Basophils Absolute: 40 cells/uL (ref 0–200)
Basophils Relative: 0.7 %
Eosinophils Absolute: 160 cells/uL (ref 15–500)
Eosinophils Relative: 2.8 %
HCT: 39.4 % (ref 35.0–45.0)
Hemoglobin: 13.7 g/dL (ref 11.7–15.5)
Lymphs Abs: 1801 cells/uL (ref 850–3900)
MCH: 31.6 pg (ref 27.0–33.0)
MCHC: 34.8 g/dL (ref 32.0–36.0)
MCV: 91 fL (ref 80.0–100.0)
MPV: 10.5 fL (ref 7.5–12.5)
Monocytes Relative: 6.7 %
Neutro Abs: 3317 cells/uL (ref 1500–7800)
Neutrophils Relative %: 58.2 %
Platelets: 284 10*3/uL (ref 140–400)
RBC: 4.33 10*6/uL (ref 3.80–5.10)
RDW: 11.5 % (ref 11.0–15.0)
Total Lymphocyte: 31.6 %
WBC: 5.7 10*3/uL (ref 3.8–10.8)

## 2021-11-22 LAB — LIPID PANEL
Cholesterol: 192 mg/dL (ref <200)
HDL: 78 mg/dL (ref >=50)
LDL Cholesterol (Calc): 97 mg/dL (calc)
Non-HDL Cholesterol (Calc): 114 mg/dL (calc) (ref <130)
Total CHOL/HDL Ratio: 2.5 (calc) (ref <5.0)
Triglycerides: 79 mg/dL (ref <150)

## 2021-11-22 LAB — COMPREHENSIVE METABOLIC PANEL
AG Ratio: 1.7 (calc) (ref 1.0–2.5)
ALT: 32 U/L — ABNORMAL HIGH (ref 6–29)
AST: 28 U/L (ref 10–35)
Albumin: 4.5 g/dL (ref 3.6–5.1)
Alkaline phosphatase (APISO): 68 U/L (ref 37–153)
BUN: 16 mg/dL (ref 7–25)
CO2: 25 mmol/L (ref 20–32)
Calcium: 10.1 mg/dL (ref 8.6–10.4)
Chloride: 102 mmol/L (ref 98–110)
Creat: 0.64 mg/dL (ref 0.50–1.05)
Globulin: 2.6 g/dL (calc) (ref 1.9–3.7)
Glucose, Bld: 80 mg/dL (ref 65–99)
Potassium: 3.9 mmol/L (ref 3.5–5.3)
Sodium: 138 mmol/L (ref 135–146)
Total Bilirubin: 0.5 mg/dL (ref 0.2–1.2)
Total Protein: 7.1 g/dL (ref 6.1–8.1)

## 2021-11-22 MED ORDER — AMLODIPINE BESYLATE 5 MG PO TABS: 5.0000 mg | ORAL_TABLET | Freq: Every day | ORAL | 3 refills | Status: DC

## 2021-11-22 NOTE — Progress Notes (Signed)
Phone [phone] In person visit   Subjective:   Michelle Howell is a 66 y.o. year old very pleasant female patient who presents for/with See problem oriented charting Chief Complaint  Patient presents with   Follow-up    Pt has no questions or concerns    Hypertension   Past Medical History-  Patient Active Problem List   Diagnosis Date Noted   Aortic atherosclerosis ([place]) 07/13/2020    Priority: Medium    Essential hypertension 11/24/2017    Priority: Medium    Hyperlipidemia 08/12/2013    Priority: Medium    GERD (gastroesophageal reflux disease) 10/10/2017    Priority: Low   Arthritis of left ankle 08/12/2013    Priority: Low   Overweight (BMI 25.0-29.9) 11/24/2017    Medications- reviewed and updated Current Outpatient Medications  Medication Sig Dispense Refill   Ascorbic Acid (VITAMIN C) 1000 MG tablet Take 1,000 mg by mouth daily.     buPROPion (WELLBUTRIN XL) 300 MG 24 hr tablet TAKE 1 TABLET BY MOUTH DAILY 90 tablet 0   Cholecalciferol (VITAMIN D) 2000 UNITS tablet Take 2,000 Units by mouth daily.     Coenzyme Q10 (CO Q 10 PO) Take by mouth daily.     Omega 3-6-9 Fatty Acids (OMEGA-3-6-9 PO) Take by mouth daily.     omeprazole (PRILOSEC) 20 MG capsule Take 1 capsule (20 mg total) by mouth daily. 90 capsule 1   PINE BARK, PYCNOGENOL, PO Take by mouth daily.     potassium chloride (MICRO-K) 10 MEQ CR capsule Take 10 mEq by mouth 2 (two) times daily.     Red Yeast Rice Extract (RED YEAST RICE PO) Take by mouth daily.     rosuvastatin (CRESTOR) 5 MG tablet TAKE ONE TABLET BY MOUTH DAILY 90 tablet 3   amLODipine (NORVASC) 5 MG tablet Take 1 tablet (5 mg total) by mouth daily. 90 tablet 3   No current facility-administered medications for this visit.     Objective:  BP 137/83 (BP Location: Right Arm, Patient Position: Sitting)   Pulse 83   Temp 98 F (36.7 C) (Temporal)   Ht '5\' 7"'$  (1.702 m)   Wt 182 lb 12.8 oz (82.9 kg)   LMP  (LMP Unknown)   SpO2 97%    BMI 28.63 kg/m  Gen: NAD, resting comfortably CV: RRR no murmurs rubs or gallops Lungs: CTAB no crackles, wheeze, rhonchi Ext: trace edema Skin: warm, dry     Assessment and Plan   #hypertension S: medication: Amlodipine 2.5 mg started May 24, 2020- increase on 11/22/21. No increased swelling Home readings #s:not checking at home  BP Readings from Last 3 Encounters:  11/22/21 137/83  05/30/21 130/80  08/03/20 138/86   A/P: High acceptable blood pressure but can lower MI risk further <135/85-we opted to increase amlodipine to 5 mg and have her update me in 2-3 weeks by mychart or can come back for a visit if she prefers.   #hyperlipidemia with coronary artery calcium score greater than 80 at 81st percentile for age #Aortic atherosclerosis S: Medication: Rosuvastatin 5 mg daily  (a lot of baseline aches so not sure if contributing) plus red yeast rice Lab Results  Component Value Date   CHOL 244 (H) 05/24/2020   HDL 53 05/24/2020   LDLCALC 164 (H) 05/24/2020   TRIG 151 (H) 05/24/2020   CHOLHDL 4.6 (H) 05/24/2020   A/P: Plan at last visit was for her to update blood work-has not done so yet-agrees to  check today  -with aortic atherosclerosis prefer LDL under 70 but this may be max tolerable dose with aches  # chronic right shoulder pain S:years of issues and worsening recently.   A/P: with ongoing issues she would like to get Dr. Rich Fuchs opinion- I think that's reasonable- referral placed   #appetite management- wellbutrin 300 mg XR has been helpful  for maintaining weigh tin overweight range- weight overall stable - down 1 lb on our scales and thinks further down.   #skin cancer screening- I asked for [facility]- team sent to Dr. Denna Haggard office which then closed- they did not enter new referral- we will ask team to look into and try to get set up with [facility]  Recommended follow up: Return in about 6 months (around 05/23/2022) for physical or sooner if  needed.Schedule b4 you leave. Future Appointments  Date Time Provider [place]  12/20/2021 10:30 AM Pyrtle, Lajuan Lines, MD [facility]    Lab/Order associations: water, nabs with breakfast at 8 30, coffee with protein drink   ICD-10-CM   1. Chronic right shoulder pain  M25.511 AMB referral to orthopedics   G89.29     2. Hyperlipidemia, unspecified hyperlipidemia type  E78.5 Comprehensive metabolic panel    CBC with Differential/Platelet    Lipid panel    3. Aortic atherosclerosis (HCC)  I70.0     4. Essential hypertension  I10 Comprehensive metabolic panel    CBC with Differential/Platelet      Meds ordered this encounter  Medications   amLODipine (NORVASC) 5 MG tablet    Sig: Take 1 tablet (5 mg total) by mouth daily.    Dispense:  90 tablet    Refill:  3    Return precautions advised.  Garret Reddish, MD

## 2021-11-22 NOTE — Patient Instructions (Addendum)
We will call you within two weeks about your referral to Dr. Griffin Basil (please verify he is in network). If you do not hear within 2 weeks, give [facility] a call directly.   -we opted to increase amlodipine to 5 mg and have her update me in 2-3 weeks by mychart or can come back for a visit if she prefers.   Please stop by lab before you go If you have mychart- we will send your results within 3 business days of [place] receiving them.  If you do not have mychart- we will call you about results within 5 business days of [place] receiving them.  *please also note that you will see labs on mychart as soon as they post. I will later go in and write notes on them- will say "notes from Dr. Yong Channel"   Recommended follow up: Return in about 6 months (around 05/23/2022) for physical or sooner if needed.Schedule b4 you leave.

## 2021-12-02 ENCOUNTER — Telehealth: Payer: Self-pay | Admitting: Family Medicine

## 2021-12-02 NOTE — Telephone Encounter (Signed)
Pt returned call regarding lab results. She is asking for a call back after 3:30pm

## 2021-12-04 NOTE — Telephone Encounter (Signed)
Please see lab notes

## 2021-12-05 ENCOUNTER — Other Ambulatory Visit: Payer: Self-pay

## 2021-12-05 DIAGNOSIS — E785 Hyperlipidemia, unspecified: Secondary | ICD-10-CM

## 2021-12-05 MED ORDER — ROSUVASTATIN CALCIUM 10 MG PO TABS: 10.0000 mg | ORAL_TABLET | Freq: Every day | ORAL | 3 refills | Status: DC

## 2021-12-05 NOTE — Telephone Encounter (Signed)
Pt has been made aware of lab results and medication has been sent to pharmacy

## 2021-12-09 ENCOUNTER — Encounter: Payer: Self-pay | Admitting: Family Medicine

## 2021-12-12 DIAGNOSIS — Z1231 Encounter for screening mammogram for malignant neoplasm of breast: Secondary | ICD-10-CM | POA: Diagnosis not present

## 2021-12-12 DIAGNOSIS — Z7689 Persons encountering health services in other specified circumstances: Secondary | ICD-10-CM | POA: Diagnosis not present

## 2021-12-12 DIAGNOSIS — Z124 Encounter for screening for malignant neoplasm of cervix: Secondary | ICD-10-CM | POA: Diagnosis not present

## 2021-12-12 DIAGNOSIS — Z01419 Encounter for gynecological examination (general) (routine) without abnormal findings: Secondary | ICD-10-CM | POA: Diagnosis not present

## 2021-12-12 LAB — HM MAMMOGRAPHY

## 2021-12-16 ENCOUNTER — Other Ambulatory Visit: Payer: Self-pay | Admitting: Obstetrics and Gynecology

## 2021-12-16 DIAGNOSIS — R928 Other abnormal and inconclusive findings on diagnostic imaging of breast: Secondary | ICD-10-CM

## 2021-12-17 ENCOUNTER — Encounter: Payer: Self-pay | Admitting: Internal Medicine

## 2021-12-20 ENCOUNTER — Ambulatory Visit (AMBULATORY_SURGERY_CENTER): Payer: PPO | Admitting: Internal Medicine

## 2021-12-20 ENCOUNTER — Encounter: Payer: Self-pay | Admitting: Internal Medicine

## 2021-12-20 VITALS — BP 134/79 | HR 66 | Temp 97.7°F | Resp 12 | Ht 67.0 in | Wt 180.0 lb

## 2021-12-20 DIAGNOSIS — Z1211 Encounter for screening for malignant neoplasm of colon: Secondary | ICD-10-CM

## 2021-12-20 DIAGNOSIS — K635 Polyp of colon: Secondary | ICD-10-CM | POA: Diagnosis not present

## 2021-12-20 DIAGNOSIS — D124 Benign neoplasm of descending colon: Secondary | ICD-10-CM | POA: Diagnosis not present

## 2021-12-20 DIAGNOSIS — D120 Benign neoplasm of cecum: Secondary | ICD-10-CM

## 2021-12-20 DIAGNOSIS — D122 Benign neoplasm of ascending colon: Secondary | ICD-10-CM | POA: Diagnosis not present

## 2021-12-20 MED ORDER — SODIUM CHLORIDE 0.9 % IV SOLN: 500.0000 mL | Freq: Once | INTRAVENOUS | Status: DC

## 2021-12-20 NOTE — Progress Notes (Signed)
Sedate, gd SR, tolerated procedure well, VSS, report to RN 

## 2021-12-20 NOTE — Progress Notes (Signed)
Called to room to assist during endoscopic procedure.  Patient ID and intended procedure confirmed with present staff. Received instructions for my participation in the procedure from the performing physician.  

## 2021-12-20 NOTE — Patient Instructions (Signed)
    HANDOUTS ON POLYPS,DIVERTICULOSIS .& HEMORRHOIDS GIVEN TO YOU TODAY  AWAIT PATHOLOGY RESULTS ON POLYPS REMOVED    YOU HAD AN ENDOSCOPIC PROCEDURE TODAY AT [facility]:   Refer to the procedure report that was given to you for any specific questions about what was found during the examination.  If the procedure report does not answer your questions, please call your gastroenterologist to clarify.  If you requested that your care partner not be given the details of your procedure findings, then the procedure report has been included in a sealed envelope for you to review at your convenience later.  YOU SHOULD EXPECT: Some feelings of bloating in the abdomen. Passage of more gas than usual.  Walking can help get rid of the air that was put into your GI tract during the procedure and reduce the bloating. If you had a lower endoscopy (such as a colonoscopy or flexible sigmoidoscopy) you may notice spotting of blood in your stool or on the toilet paper. If you underwent a bowel prep for your procedure, you may not have a normal bowel movement for a few days.  Please Note:  You might notice some irritation and congestion in your nose or some drainage.  This is from the oxygen used during your procedure.  There is no need for concern and it should clear up in a day or so.  SYMPTOMS TO REPORT IMMEDIATELY:  Following lower endoscopy (colonoscopy or flexible sigmoidoscopy):  Excessive amounts of blood in the stool  Significant tenderness or worsening of abdominal pains  Swelling of the abdomen that is new, acute  Fever of 100F or higher   For urgent or emergent issues, a gastroenterologist can be reached at any hour by calling [phone]. Do not use MyChart messaging for urgent concerns.    DIET:  We do recommend a small meal at first, but then you may proceed to your regular diet.  Drink plenty of fluids but you should avoid alcoholic beverages for 24 hours.  ACTIVITY:   You should plan to take it easy for the rest of today and you should NOT DRIVE or use heavy machinery until tomorrow (because of the sedation medicines used during the test).    FOLLOW UP: Our staff will call the number listed on your records the next business day following your procedure.  We will call around 7:15- 8:00 am to check on you and address any questions or concerns that you may have regarding the information given to you following your procedure. If we do not reach you, we will leave a message.     If any biopsies were taken you will be contacted by phone or by letter within the next 1-3 weeks.  Please call us at [phone] if you have not heard about the biopsies in 3 weeks.    SIGNATURES/CONFIDENTIALITY: You and/or your care partner have signed paperwork which will be entered into your electronic medical record.  These signatures attest to the fact that that the information above on your After Visit Summary has been reviewed and is understood.  Full responsibility of the confidentiality of this discharge information lies with you and/or your care-partner.

## 2021-12-20 NOTE — Progress Notes (Signed)
GASTROENTEROLOGY PROCEDURE H&P NOTE   Primary Care Physician: Marin Olp, MD    Reason for Procedure:  Colon cancer screening  Plan:    Colonoscopy  Patient is appropriate for endoscopic procedure(s) in the ambulatory ([place]) setting.  The nature of the procedure, as well as the risks, benefits, and alternatives were carefully and thoroughly reviewed with the patient. Ample time for discussion and questions allowed. The patient understood, was satisfied, and agreed to proceed.     HPI: Michelle Howell is a 66 y.o. female who presents for colonoscopy.  Medical history as below.  Tolerated the prep.  No recent chest pain or shortness of breath.  No abdominal pain today.  Past Medical History:  Diagnosis Date   GERD (gastroesophageal reflux disease)     Past Surgical History:  Procedure Laterality Date   dental implants  [place]   left   SHOULDER SURGERY  1991   right    Prior to Admission medications   Medication Sig Start Date End Date Taking? Authorizing Provider  amLODipine (NORVASC) 5 MG tablet Take 1 tablet (5 mg total) by mouth daily. 11/22/21  Yes Marin Olp, MD  Ascorbic Acid (VITAMIN C) 1000 MG tablet Take 1,000 mg by mouth daily.   Yes [provider]  buPROPion (WELLBUTRIN XL) 300 MG 24 hr tablet TAKE 1 TABLET BY MOUTH DAILY 10/14/21  Yes Marin Olp, MD  Cholecalciferol (VITAMIN D) 2000 UNITS tablet Take 2,000 Units by mouth daily.   Yes [provider]  Coenzyme Q10 (CO Q 10 PO) Take by mouth daily.   Yes [provider]  Omega 3-6-9 Fatty Acids (OMEGA-3-6-9 PO) Take by mouth daily.   Yes [provider]  omeprazole (PRILOSEC) 20 MG capsule Take 1 capsule (20 mg total) by mouth daily. 10/08/17  Yes Marin Olp, MD  PINE BARK, PYCNOGENOL, PO Take by mouth daily.   Yes [provider]  potassium chloride (MICRO-K) 10 MEQ CR capsule Take 10 mEq by mouth 2 (two) times daily.    Yes [provider]  Red Yeast Rice Extract (RED YEAST RICE PO) Take by mouth daily.   Yes [provider]  rosuvastatin (CRESTOR) 10 MG tablet Take 1 tablet (10 mg total) by mouth daily. 12/05/21  Yes Marin Olp, MD    Current Outpatient Medications  Medication Sig Dispense Refill   amLODipine (NORVASC) 5 MG tablet Take 1 tablet (5 mg total) by mouth daily. 90 tablet 3   Ascorbic Acid (VITAMIN C) 1000 MG tablet Take 1,000 mg by mouth daily.     buPROPion (WELLBUTRIN XL) 300 MG 24 hr tablet TAKE 1 TABLET BY MOUTH DAILY 90 tablet 0   Cholecalciferol (VITAMIN D) 2000 UNITS tablet Take 2,000 Units by mouth daily.     Coenzyme Q10 (CO Q 10 PO) Take by mouth daily.     Omega 3-6-9 Fatty Acids (OMEGA-3-6-9 PO) Take by mouth daily.     omeprazole (PRILOSEC) 20 MG capsule Take 1 capsule (20 mg total) by mouth daily. 90 capsule 1   PINE BARK, PYCNOGENOL, PO Take by mouth daily.     potassium chloride (MICRO-K) 10 MEQ CR capsule Take 10 mEq by mouth 2 (two) times daily.     Red Yeast Rice Extract (RED YEAST RICE PO) Take by mouth daily.     rosuvastatin (CRESTOR) 10 MG tablet Take 1 tablet (10 mg total) by mouth daily. 90 tablet  3   Current Facility-Administered Medications  Medication Dose Route Frequency Provider Last Rate Last Admin   0.9 %  sodium chloride infusion  500 mL Intravenous Once Anaisabel Pederson, Lajuan Lines, MD        Allergies as of 12/20/2021 - Review Complete 12/20/2021  Allergen Reaction Noted   Meperidine hcl Nausea And Vomiting 09/02/2007   Penicillins  09/02/2007    Family History  Problem Relation Age of Onset   Hypertension Mother    Hyperlipidemia Mother    Heart disease Mother    Heart attack Mother    Heart disease Father    Hypertension Father    Heart failure Father    Heart attack Father    Diabetes Sister    CAD Sister        minor heart attack in her 22s   Kidney disease Sister        required transplant after severe c diff   Diabetes  Brother    Cancer Brother        kidney   Heart disease Brother        died at 71    Other Brother        back issues   CAD Brother        age 68   Colon cancer Neg Hx    Colon polyps Neg Hx    Esophageal cancer Neg Hx    Rectal cancer Neg Hx    Stomach cancer Neg Hx     Social History   Socioeconomic History   Marital status: Married    Spouse name: Not on file   Number of children: Not on file   Years of education: Not on file   Highest education level: Not on file  Occupational History   Not on file  Tobacco Use   Smoking status: Never   Smokeless tobacco: Never  Vaping Use   Vaping Use: Never used  Substance and Sexual Activity   Alcohol use: Yes    Alcohol/week: 14.0 standard drinks of alcohol    Types: 14 Cans of beer per week   Drug use: No   Sexual activity: Not on file  Other Topics Concern   Not on file  Social History Narrative   Married      Geophysicist/field seismologist      Hobbies: tennis   Social Determinants of Health   Financial Resource Strain: Not on file  Food Insecurity: Not on file  Transportation Needs: Not on file  Physical Activity: Not on file  Stress: Not on file  Social Connections: Not on file  Intimate Partner Violence: Not on file    Physical Exam: Vital signs in last 24 hours: '@BP'$  (!) 145/89   Pulse 81   Temp 97.7 F (36.5 C)   Ht '5\' 7"'$  (1.702 m)   Wt 180 lb (81.6 kg)   LMP  (LMP Unknown)   SpO2 97%   BMI 28.19 kg/m  GEN: NAD EYE: Sclerae anicteric ENT: MMM CV: Non-tachycardic Pulm: CTA b/l GI: Soft, NT/ND NEURO:  Alert & Oriented x 3   Zenovia Jarred, MD [facility]  12/20/2021 10:33 AM

## 2021-12-20 NOTE — Progress Notes (Signed)
VS by CW  Pt's states no medical or surgical changes since previsit or office visit.  

## 2021-12-20 NOTE — Op Note (Signed)
[place] Patient Name: Michelle Howell Procedure Date: 12/20/2021 10:27 AM MRN: [id number] Endoscopist: Jerene Bears , MD, [id number] Age: 66 Referring MD:  Date of Birth: Apr 15, 1955 Gender: Female Account #: [id number] Procedure:                Colonoscopy Indications:              Screening for colorectal malignant neoplasm, Last                            colonoscopy 10 years ago Medicines:                Monitored Anesthesia Care Procedure:                Pre-Anesthesia Assessment:                           - Prior to the procedure, a History and Physical                            was performed, and patient medications and                            allergies were reviewed. The patient's tolerance of                            previous anesthesia was also reviewed. The risks                            and benefits of the procedure and the sedation                            options and risks were discussed with the patient.                            All questions were answered, and informed consent                            was obtained. Prior Anticoagulants: The patient has                            taken no anticoagulant or antiplatelet agents. ASA                            Grade Assessment: II - A patient with mild systemic                            disease. After reviewing the risks and benefits,                            the patient was deemed in satisfactory condition to                            undergo the procedure.  After obtaining informed consent, the colonoscope                            was passed under direct vision. Throughout the                            procedure, the patient's blood pressure, pulse, and                            oxygen saturations were monitored continuously. The                            PCF-HQ190L Colonoscope was introduced through the                            anus and advanced to the  cecum, identified by                            appendiceal orifice and ileocecal valve. The                            colonoscopy was performed without difficulty. The                            patient tolerated the procedure well. The quality                            of the bowel preparation was good. The ileocecal                            valve, appendiceal orifice, and rectum were                            photographed. Scope In: 10:43:15 AM Scope Out: 11:06:25 AM Scope Withdrawal Time: 0 hours 17 minutes 34 seconds  Total Procedure Duration: 0 hours 23 minutes 10 seconds  Findings:                 The digital rectal exam was normal.                           Three sessile polyps were found in the cecum. The                            polyps were 4 to 7 mm in size. These polyps were                            removed with a cold snare. Resection and retrieval                            were complete.                           Two sessile polyps were found in the ascending  colon. The polyps were 4 to 5 mm in size. These                            polyps were removed with a cold snare. Resection                            and retrieval were complete.                           A 6 mm polyp was found in the descending colon. The                            polyp was sessile. The polyp was removed with a                            cold snare. Resection and retrieval were complete.                           Multiple medium-mouthed and small-mouthed                            diverticula were found in the sigmoid colon,                            descending colon and ascending colon.                           Internal hemorrhoids were found during                            retroflexion. The hemorrhoids were small. Complications:            No immediate complications. Estimated Blood Loss:     Estimated blood loss was minimal. Impression:               -  Three 4 to 7 mm polyps in the cecum, removed with                            a cold snare. Resected and retrieved.                           - Two 4 to 5 mm polyps in the ascending colon,                            removed with a cold snare. Resected and retrieved.                           - One 6 mm polyp in the descending colon, removed                            with a cold snare. Resected and retrieved.                           -  Moderate diverticulosis in the sigmoid colon, in                            the descending colon and in the ascending colon.                           - Small internal hemorrhoids. Recommendation:           - Patient has a contact number available for                            emergencies. The signs and symptoms of potential                            delayed complications were discussed with the                            patient. Return to normal activities tomorrow.                            Written discharge instructions were provided to the                            patient.                           - Resume previous diet.                           - Continue present medications.                           - Await pathology results.                           - Repeat colonoscopy is recommended for                            surveillance. The colonoscopy date will be                            determined after pathology results from today's                            exam become available for review. Jerene Bears, MD 12/20/2021 11:10:09 AM This report has been signed electronically.

## 2021-12-23 ENCOUNTER — Telehealth: Payer: Self-pay | Admitting: *Deleted

## 2021-12-23 NOTE — Telephone Encounter (Signed)
Left message on f/u call 

## 2021-12-24 ENCOUNTER — Telehealth: Payer: Self-pay | Admitting: Family Medicine

## 2021-12-24 ENCOUNTER — Encounter: Payer: Self-pay | Admitting: Internal Medicine

## 2021-12-24 NOTE — Telephone Encounter (Signed)
Copied from [place] [phone]. Topic: Medicare AWV >> Dec 24, 2021 11:38 AM Gillis Santa wrote: Reason for CRM: LVM PATIENT CALL [place]

## 2021-12-31 DIAGNOSIS — M25511 Pain in right shoulder: Secondary | ICD-10-CM | POA: Diagnosis not present

## 2021-12-31 DIAGNOSIS — M542 Cervicalgia: Secondary | ICD-10-CM | POA: Diagnosis not present

## 2022-01-02 ENCOUNTER — Ambulatory Visit
Admission: RE | Admit: 2022-01-02 | Discharge: 2022-01-02 | Disposition: A | Discharge status: Home / Self Care | Payer: PPO | Source: Ambulatory Visit | Attending: Obstetrics and Gynecology | Admitting: Obstetrics and Gynecology

## 2022-01-02 ENCOUNTER — Other Ambulatory Visit: Payer: Self-pay | Admitting: Obstetrics and Gynecology

## 2022-01-02 DIAGNOSIS — N6321 Unspecified lump in the left breast, upper outer quadrant: Secondary | ICD-10-CM | POA: Diagnosis not present

## 2022-01-02 DIAGNOSIS — R599 Enlarged lymph nodes, unspecified: Secondary | ICD-10-CM

## 2022-01-02 DIAGNOSIS — R928 Other abnormal and inconclusive findings on diagnostic imaging of breast: Secondary | ICD-10-CM

## 2022-01-02 DIAGNOSIS — R922 Inconclusive mammogram: Secondary | ICD-10-CM | POA: Diagnosis not present

## 2022-01-02 DIAGNOSIS — N6322 Unspecified lump in the left breast, upper inner quadrant: Secondary | ICD-10-CM | POA: Diagnosis not present

## 2022-01-02 DIAGNOSIS — N6320 Unspecified lump in the left breast, unspecified quadrant: Secondary | ICD-10-CM

## 2022-01-09 ENCOUNTER — Other Ambulatory Visit: Payer: Self-pay | Admitting: Family Medicine

## 2022-01-10 ENCOUNTER — Ambulatory Visit
Admission: RE | Admit: 2022-01-10 | Discharge: 2022-01-10 | Disposition: A | Discharge status: Home / Self Care | Payer: PPO | Source: Ambulatory Visit | Attending: Obstetrics and Gynecology | Admitting: Obstetrics and Gynecology

## 2022-01-10 DIAGNOSIS — N6322 Unspecified lump in the left breast, upper inner quadrant: Secondary | ICD-10-CM | POA: Diagnosis not present

## 2022-01-10 DIAGNOSIS — N6320 Unspecified lump in the left breast, unspecified quadrant: Secondary | ICD-10-CM

## 2022-01-10 DIAGNOSIS — R599 Enlarged lymph nodes, unspecified: Secondary | ICD-10-CM

## 2022-01-10 DIAGNOSIS — R928 Other abnormal and inconclusive findings on diagnostic imaging of breast: Secondary | ICD-10-CM

## 2022-01-10 DIAGNOSIS — C50812 Malignant neoplasm of overlapping sites of left female breast: Secondary | ICD-10-CM | POA: Diagnosis not present

## 2022-01-10 DIAGNOSIS — R590 Localized enlarged lymph nodes: Secondary | ICD-10-CM | POA: Diagnosis not present

## 2022-01-10 HISTORY — PX: BREAST BIOPSY: SHX20

## 2022-01-13 ENCOUNTER — Telehealth: Payer: Self-pay | Admitting: Hematology and Oncology

## 2022-01-13 NOTE — Telephone Encounter (Signed)
Spoke to patient to confirm upcoming morning [facility] clinic appointment on 1/17, paperwork will be sent via mail.   Gave location and time, also informed patient that the surgeon's office would be calling as well to get information from them similar to the packet that they will be receiving so make sure to do both.  Reminded patient that all providers will be coming to the clinic to see them HERE and if they had any questions to not hesitate to reach back out to myself or their navigators.

## 2022-01-21 ENCOUNTER — Encounter: Payer: Self-pay | Admitting: *Deleted

## 2022-01-21 DIAGNOSIS — C50412 Malignant neoplasm of upper-outer quadrant of left female breast: Secondary | ICD-10-CM | POA: Insufficient documentation

## 2022-01-21 NOTE — Progress Notes (Signed)
Radiation Oncology         ([phone]) [phone] ________________________________  Multidisciplinary Breast Oncology Clinic [facility]) Initial Outpatient Consultation  Name: Michelle Howell MRN: [id number]  Date: 01/22/2022  DOB: 03-06-1955  CH:YIFOYD, Brayton Mars, MD  Rolm Bookbinder, MD   REFERRING PHYSICIAN: Rolm Bookbinder, MD  DIAGNOSIS: The encounter diagnosis was Malignant neoplasm of upper-outer quadrant of left breast in female, estrogen receptor positive ([place]).  Stage IA (cT1b, cN0, cM0) Left Breast UOQ, Invasive Ductal Carcinoma, ER+ / PR+ / Her2-, Grade 1    ICD-10-CM   1. Malignant neoplasm of upper-outer quadrant of left breast in female, estrogen receptor positive ([place])  C50.412    Z17.0       HISTORY OF PRESENT ILLNESS::Michelle Howell is a 67 y.o. female who is presenting to the office today for evaluation of her newly diagnosed breast cancer. She is accompanied by her wife. She is doing well overall.   She had routine screening mammography on 12/12/21 showing a possible abnormality in the left breast. She underwent left breast diagnostic mammography with tomography and left breast ultrasonography at [facility] on 01/02/22 showing: a 9 x 8 x 7 mm irregular, hypoechoic mass in the 12 o'clock position of the left breast, 6 cm from the nipple. [place] also showed a single left axillary lymph node with mild eccentric cortical thickening measuring up to 4.0 mm, and a 2nd lymph node with borderline eccentric cortical thickening measuring up to 3.0 mm.   Biopsy of the 12 o'clock left breast on 01/10/21 showed: grade 1 invasive ductal carcinoma measuring 6 mm in the greatest linear extent of the sample. Prognostic indicators significant for: estrogen receptor, 100% positive with strong staining intensity and progesterone receptor, 90% positive with weak-moderate staining intensity. Proliferation marker Ki67 at <1%. HER2 negative.  Left axillary lymph node biopsy showed benign  findings.   Menarche: 67 years old GP: 0 LMP: postmenopausal (unsure as to date of LMP) Contraceptive: yes, used for 10-15 years (type not indicated on the provided form) HRT: never used   The patient was referred today for presentation in the multidisciplinary conference.  Radiology studies and pathology slides were presented there for review and discussion of treatment options.  A consensus was discussed regarding potential next steps.  PREVIOUS RADIATION THERAPY: No  PAST MEDICAL HISTORY:  Past Medical History:  Diagnosis Date   GERD (gastroesophageal reflux disease)    Hypertension     PAST SURGICAL HISTORY: Past Surgical History:  Procedure Laterality Date   BREAST BIOPSY Left 01/10/2022   [place] LT BREAST BX W LOC DEV 1ST LESION IMG BX SPEC US GUIDE 01/10/2022 [facility] MAMMOGRAPHY   dental implants  1985   KNEE SURGERY  1982   left   SHOULDER SURGERY  1991   right    FAMILY HISTORY:  Family History  Problem Relation Age of Onset   Hypertension Mother    Hyperlipidemia Mother    Heart disease Mother    Heart attack Mother    Heart disease Father    Hypertension Father    Heart failure Father    Heart attack Father    Diabetes Sister    CAD Sister        minor heart attack in her 23s   Kidney disease Sister        required transplant after severe c diff   Diabetes Brother    Cancer Brother        kidney   Heart disease Brother  died at 25    Other Brother        back issues   CAD Brother        age 75   Colon cancer Neg Hx    Colon polyps Neg Hx    Esophageal cancer Neg Hx    Rectal cancer Neg Hx    Stomach cancer Neg Hx     SOCIAL HISTORY:  Social History   Socioeconomic History   Marital status: Married    Spouse name: Not on file   Number of children: Not on file   Years of education: Not on file   Highest education level: Not on file  Occupational History   Not on file  Tobacco Use   Smoking status: Never   Smokeless tobacco: Never   Vaping Use   Vaping Use: Never used  Substance and Sexual Activity   Alcohol use: Yes    Alcohol/week: 14.0 standard drinks of alcohol    Types: 14 Cans of beer per week   Drug use: No   Sexual activity: Not on file  Other Topics Concern   Not on file  Social History Narrative   Married      Geophysicist/field seismologist      Hobbies: tennis   Social Determinants of Health   Financial Resource Strain: Not on file  Food Insecurity: Not on file  Transportation Needs: Not on file  Physical Activity: Not on file  Stress: Not on file  Social Connections: Not on file    ALLERGIES:  Allergies  Allergen Reactions   Meperidine Hcl Nausea And Vomiting   Penicillins     Per pt: unknown    MEDICATIONS:  Current Outpatient Medications  Medication Sig Dispense Refill   amLODipine (NORVASC) 5 MG tablet Take 1 tablet (5 mg total) by mouth daily. 90 tablet 3   Ascorbic Acid (VITAMIN C) 1000 MG tablet Take 1,000 mg by mouth daily.     buPROPion (WELLBUTRIN XL) 300 MG 24 hr tablet TAKE 1 TABLET BY MOUTH DAILY 90 tablet 0   Cholecalciferol (VITAMIN D) 2000 UNITS tablet Take 2,000 Units by mouth daily.     Coenzyme Q10 (CO Q 10 PO) Take by mouth daily.     Omega 3-6-9 Fatty Acids (OMEGA-3-6-9 PO) Take by mouth daily.     omeprazole (PRILOSEC) 20 MG capsule Take 1 capsule (20 mg total) by mouth daily. 90 capsule 1   PINE BARK, PYCNOGENOL, PO Take by mouth daily.     potassium chloride (MICRO-K) 10 MEQ CR capsule Take 10 mEq by mouth 2 (two) times daily.     Red Yeast Rice Extract (RED YEAST RICE PO) Take by mouth daily.     rosuvastatin (CRESTOR) 10 MG tablet Take 1 tablet (10 mg total) by mouth daily. 90 tablet 3   No current facility-administered medications for this encounter.    REVIEW OF SYSTEMS: A 10+ POINT REVIEW OF SYSTEMS WAS OBTAINED including neurology, dermatology, psychiatry, cardiac, respiratory, lymph, extremities, GI, GU, musculoskeletal, constitutional, reproductive, HEENT. On  the provided form, she reports wearing glasses, heartburn, abdominal pain, back pain, joint pain, and arthritis. She denies any other symptoms.    PHYSICAL EXAM:     01/22/2022  Vitals with BMI   Height   Weight 178 lbs 13 oz   BMI   Systolic 188 !   Diastolic 94 (H)   Pulse 69        Lungs are clear to auscultation bilaterally. Heart has regular  rate and rhythm. No palpable cervical, supraclavicular, or axillary adenopathy. Abdomen soft, non-tender, normal bowel sounds. Breast: Right breast with no palpable mass, nipple discharge, or bleeding. Left breast with a biopsy site in the upper outer quadrant and a biopsy site in the axillary region with some associated bruising. No palpable mass, nipple discharge, or bleeding.   KPS = 100   100 - Normal; no complaints; no evidence of disease. 90   - Able to carry on normal activity; minor signs or symptoms of disease. 80   - Normal activity with effort; some signs or symptoms of disease. 33   - Cares for self; unable to carry on normal activity or to do active work. 60   - Requires occasional assistance, but is able to care for most of his personal needs. 50   - Requires considerable assistance and frequent medical care. 66   - Disabled; requires special care and assistance. 16   - Severely disabled; hospital admission is indicated although death not imminent. 48   - Very sick; hospital admission necessary; active supportive treatment necessary. 10   - Moribund; fatal processes progressing rapidly. 0     - Dead  Karnofsky DA, Abelmann [place], Craver LS and Burchenal [facility] [phone]) The use of the nitrogen mustards in the palliative treatment of carcinoma: with particular reference to bronchogenic carcinoma Cancer 1 634-56  LABORATORY DATA:  Lab Results  Component Value Date   WBC 4.2 01/22/2022   HGB 13.6 01/22/2022   HCT 39.1 01/22/2022   MCV 91.1 01/22/2022   PLT 267 01/22/2022   Lab Results  Component Value Date   NA 138 01/22/2022    K 4.1 01/22/2022   CL 104 01/22/2022   CO2 29 01/22/2022   Lab Results  Component Value Date   ALT 32 01/22/2022   AST 22 01/22/2022   ALKPHOS 63 01/22/2022   BILITOT 0.8 01/22/2022    PULMONARY FUNCTION TEST:   Review Flowsheet        No data to display          RADIOGRAPHY: [place] LT BREAST BX W LOC DEV 1ST LESION IMG BX SPEC US GUIDE  Addendum Date: 01/14/2022   ADDENDUM REPORT: 01/14/2022 10:21 ADDENDUM: Pathology revealed GRADE I INVASIVE WELL-DIFFERENTIATED DUCTAL ADENOCARCINOMA of the LEFT breast, 12 o'clock, (ribbon clip). This was found to be concordant by Dr. Kristopher Oppenheim. Pathology revealed COMPATIBLE WITH A BENIGN LYMPH NODE of the LEFT axilla, (coil clip). This was found to be concordant by Dr. Kristopher Oppenheim. Pathology results were discussed with the patient by telephone. The patient reported doing well after the biopsies with tenderness at the sites. Post biopsy instructions and care were reviewed and questions were answered. The patient was encouraged to call [facility] for any additional concerns. My direct phone number was provided. The patient was referred to [facility] on January 22, 2022. Pathology results reported by Terie Purser, RN on 01/14/2022. Electronically Signed   By: Kristopher Oppenheim M.D.   On: 01/14/2022 10:21   Result Date: 01/14/2022 CLINICAL DATA:  67 year old female with a suspicious left breast mass and indeterminate left axillary lymph node. EXAM: ULTRASOUND GUIDED LEFT BREAST CORE NEEDLE BIOPSY COMPARISON:  Previous exam(s). PROCEDURE: I met with the patient and we discussed the procedure of ultrasound-guided biopsy, including benefits and alternatives. We discussed the high likelihood of a successful procedure. We discussed the risks of the procedure,  including infection, bleeding, tissue injury, clip migration, and inadequate sampling. Informed written  consent was given. The usual time-out protocol was performed immediately prior to the procedure. Lesion quadrant: Upper inner quadrant Using sterile technique and 1% Lidocaine as local anesthetic, under direct ultrasound visualization, a 14 gauge spring-loaded device was used to perform biopsy of a mass at the 12 o'clock position of the left breast using a lateral approach. At the conclusion of the procedure a ribbon shaped tissue marker clip was deployed into the biopsy cavity. Using sterile technique and 1% Lidocaine as local anesthetic, under direct ultrasound visualization, a 14 gauge spring-loaded device was used to perform biopsy of a left axillary lymph node using a inferior approach. At the conclusion of the procedure a coil shaped tissue marker clip was deployed into the biopsy cavity. Follow up 2 view mammogram was performed and dictated separately. IMPRESSION: Ultrasound guided biopsy of the left breast and axilla. No apparent complications. Electronically Signed: By: Kristopher Oppenheim M.D. On: 01/10/2022 15:33  [place] AXILLARY NODE CORE BIOPSY LEFT  Addendum Date: 01/14/2022   ADDENDUM REPORT: 01/14/2022 10:21 ADDENDUM: Pathology revealed GRADE I INVASIVE WELL-DIFFERENTIATED DUCTAL ADENOCARCINOMA of the LEFT breast, 12 o'clock, (ribbon clip). This was found to be concordant by Dr. Kristopher Oppenheim. Pathology revealed COMPATIBLE WITH A BENIGN LYMPH NODE of the LEFT axilla, (coil clip). This was found to be concordant by Dr. Kristopher Oppenheim. Pathology results were discussed with the patient by telephone. The patient reported doing well after the biopsies with tenderness at the sites. Post biopsy instructions and care were reviewed and questions were answered. The patient was encouraged to call [facility] for any additional concerns. My direct phone number was provided. The patient was referred to [facility] on  January 22, 2022. Pathology results reported by Terie Purser, RN on 01/14/2022. Electronically Signed   By: Kristopher Oppenheim M.D.   On: 01/14/2022 10:21   Result Date: 01/14/2022 CLINICAL DATA:  67 year old female with a suspicious left breast mass and indeterminate left axillary lymph node. EXAM: ULTRASOUND GUIDED LEFT BREAST CORE NEEDLE BIOPSY COMPARISON:  Previous exam(s). PROCEDURE: I met with the patient and we discussed the procedure of ultrasound-guided biopsy, including benefits and alternatives. We discussed the high likelihood of a successful procedure. We discussed the risks of the procedure, including infection, bleeding, tissue injury, clip migration, and inadequate sampling. Informed written consent was given. The usual time-out protocol was performed immediately prior to the procedure. Lesion quadrant: Upper inner quadrant Using sterile technique and 1% Lidocaine as local anesthetic, under direct ultrasound visualization, a 14 gauge spring-loaded device was used to perform biopsy of a mass at the 12 o'clock position of the left breast using a lateral approach. At the conclusion of the procedure a ribbon shaped tissue marker clip was deployed into the biopsy cavity. Using sterile technique and 1% Lidocaine as local anesthetic, under direct ultrasound visualization, a 14 gauge spring-loaded device was used to perform biopsy of a left axillary lymph node using a inferior approach. At the conclusion of the procedure a coil shaped tissue marker clip was deployed into the biopsy cavity. Follow up 2 view mammogram was performed and dictated separately. IMPRESSION: Ultrasound guided biopsy of the left breast and axilla. No apparent complications. Electronically Signed: By: Kristopher Oppenheim M.D. On: 01/10/2022 15:33  MM CLIP PLACEMENT LEFT  Result Date: 01/10/2022 CLINICAL DATA:  Status post left breast biopsy. EXAM: 3D  DIAGNOSTIC LEFT MAMMOGRAM POST ULTRASOUND BIOPSY COMPARISON:  Previous exam(s). FINDINGS: 3D  Mammographic images were obtained following ultrasound guided biopsy of the left breast and axilla. The biopsy marking clip is in expected position at the site of biopsy. Coil shaped clip is also identified in the left axilla. IMPRESSION: Appropriate positioning of the ribbon shaped biopsy marking clip at the site of biopsy in the upper left breast. Coil shaped clip identified in the left axilla. Final Assessment: Post Procedure Mammograms for Marker Placement Electronically Signed   By: Kristopher Oppenheim M.D.   On: 01/10/2022 15:59  MM DIAG BREAST TOMO UNI LEFT  Result Date: 01/02/2022 CLINICAL DATA:  Possible distortion in the 12 o'clock position of the left breast on a recent screening mammogram. EXAM: DIGITAL DIAGNOSTIC UNILATERAL LEFT MAMMOGRAM WITH TOMOSYNTHESIS; ULTRASOUND LEFT BREAST LIMITED TECHNIQUE: Left digital diagnostic mammography and breast tomosynthesis was performed.; Targeted ultrasound examination of the left breast was performed. COMPARISON:  Previous exam(s). ACR Breast Density Category c: The breast tissue is heterogeneously dense, which may obscure small masses. FINDINGS: 3D tomographic and 2D generated spot-compression images of the left breast confirm a small area distortion in the 12 o'clock position of the left breast. Targeted ultrasound is performed, showing a 9 x 8 x 7 mm irregular, hypoechoic mass with posterior acoustical shadowing in the 12 o'clock position of the left breast, 6 cm from the nipple. This corresponds to the location of distortion mammographically. There is a single left axillary lymph node with mild eccentric cortical thickening measuring up to 4.0 mm. There is a 2nd lymph node with borderline eccentric cortical thickening measuring up to 3.0 mm. IMPRESSION: 1. 9 mm mass with imaging features highly suspicious for malignancy in the 12 o'clock position of the left breast. 2. One mildly abnormal left axillary lymph node and 1 borderline abnormal left axillary lymph  node suspicious for metastatic nodes. RECOMMENDATION: 1. Ultrasound-guided core needle biopsy of the 9 mm mass in the 12 o'clock position of the left breast. 2. Ultrasound-guided core needle biopsy of the left axillary lymph node with mild eccentric cortical thickening. Note: The results and recommendations have been discussed with the patient and the biopsies have been scheduled at 2:45 p.m. on 01/10/2022. I have discussed the findings and recommendations with the patient. If applicable, a reminder letter will be sent to the patient regarding the next appointment. BI-RADS CATEGORY  4: Suspicious. Electronically Signed   By: Claudie Revering M.D.   On: 01/02/2022 11:35  US BREAST LTD UNI LEFT INC AXILLA  Result Date: 01/02/2022 CLINICAL DATA:  Possible distortion in the 12 o'clock position of the left breast on a recent screening mammogram. EXAM: DIGITAL DIAGNOSTIC UNILATERAL LEFT MAMMOGRAM WITH TOMOSYNTHESIS; ULTRASOUND LEFT BREAST LIMITED TECHNIQUE: Left digital diagnostic mammography and breast tomosynthesis was performed.; Targeted ultrasound examination of the left breast was performed. COMPARISON:  Previous exam(s). ACR Breast Density Category c: The breast tissue is heterogeneously dense, which may obscure small masses. FINDINGS: 3D tomographic and 2D generated spot-compression images of the left breast confirm a small area distortion in the 12 o'clock position of the left breast. Targeted ultrasound is performed, showing a 9 x 8 x 7 mm irregular, hypoechoic mass with posterior acoustical shadowing in the 12 o'clock position of the left breast, 6 cm from the nipple. This corresponds to the location of distortion mammographically. There is a single left axillary lymph node with mild eccentric cortical thickening measuring up to 4.0 mm. There is a 2nd lymph node with  borderline eccentric cortical thickening measuring up to 3.0 mm. IMPRESSION: 1. 9 mm mass with imaging features highly suspicious for malignancy  in the 12 o'clock position of the left breast. 2. One mildly abnormal left axillary lymph node and 1 borderline abnormal left axillary lymph node suspicious for metastatic nodes. RECOMMENDATION: 1. Ultrasound-guided core needle biopsy of the 9 mm mass in the 12 o'clock position of the left breast. 2. Ultrasound-guided core needle biopsy of the left axillary lymph node with mild eccentric cortical thickening. Note: The results and recommendations have been discussed with the patient and the biopsies have been scheduled at 2:45 p.m. on 01/10/2022. I have discussed the findings and recommendations with the patient. If applicable, a reminder letter will be sent to the patient regarding the next appointment. BI-RADS CATEGORY  4: Suspicious. Electronically Signed   By: Claudie Revering M.D.   On: 01/02/2022 11:35     IMPRESSION: Stage IA (cT1b, cN0, cM0) Left Breast UOQ, Invasive Ductal Carcinoma, ER+ / PR+ / Her2-, Grade 1  Patient will be a good candidate for breast conservation with radiotherapy to the left breast. We discussed the general course of radiation, potential side effects, and toxicities with radiation and the patient is interested in this approach.    PLAN:  Genetics  Lumpectomy +/- SLN biopsies  Oncotype depending on final pathology Adjuvant radiation therapy  Aromatase inhibitor    ------------------------------------------------  Blair Promise, PhD, MD  This document serves as a record of services personally performed by Gery Pray, MD. It was created on his behalf by Roney Mans, a trained medical scribe. The creation of this record is based on the scribe's personal observations and the provider's statements to them. This document has been checked and approved by the attending provider.

## 2022-01-22 ENCOUNTER — Inpatient Hospital Stay (HOSPITAL_BASED_OUTPATIENT_CLINIC_OR_DEPARTMENT_OTHER): Payer: PPO | Admitting: Hematology and Oncology

## 2022-01-22 ENCOUNTER — Encounter: Payer: Self-pay | Admitting: Genetic Counselor

## 2022-01-22 ENCOUNTER — Encounter: Payer: Self-pay | Admitting: Obstetrics and Gynecology

## 2022-01-22 ENCOUNTER — Encounter: Payer: Self-pay | Admitting: *Deleted

## 2022-01-22 ENCOUNTER — Other Ambulatory Visit: Payer: Self-pay

## 2022-01-22 ENCOUNTER — Ambulatory Visit: Payer: PPO | Admitting: Physical Therapy

## 2022-01-22 ENCOUNTER — Other Ambulatory Visit: Payer: Self-pay | Admitting: General Surgery

## 2022-01-22 ENCOUNTER — Inpatient Hospital Stay: Payer: PPO | Admitting: Licensed Clinical Social Worker

## 2022-01-22 ENCOUNTER — Inpatient Hospital Stay (HOSPITAL_BASED_OUTPATIENT_CLINIC_OR_DEPARTMENT_OTHER): Payer: PPO | Admitting: Genetic Counselor

## 2022-01-22 ENCOUNTER — Inpatient Hospital Stay: Payer: PPO | Attending: Hematology and Oncology

## 2022-01-22 ENCOUNTER — Ambulatory Visit
Admission: RE | Admit: 2022-01-22 | Discharge: 2022-01-22 | Disposition: A | Discharge status: Home / Self Care | Payer: PPO | Source: Ambulatory Visit | Attending: Radiation Oncology | Admitting: Radiation Oncology

## 2022-01-22 VITALS — BP 153/94 | HR 69 | Temp 97.0°F | Resp 15 | Wt 178.8 lb

## 2022-01-22 DIAGNOSIS — Z808 Family history of malignant neoplasm of other organs or systems: Secondary | ICD-10-CM | POA: Diagnosis not present

## 2022-01-22 DIAGNOSIS — C50412 Malignant neoplasm of upper-outer quadrant of left female breast: Secondary | ICD-10-CM

## 2022-01-22 DIAGNOSIS — I10 Essential (primary) hypertension: Secondary | ICD-10-CM | POA: Insufficient documentation

## 2022-01-22 DIAGNOSIS — Z170 Estrogen receptor positive status [ER+]: Secondary | ICD-10-CM

## 2022-01-22 DIAGNOSIS — Z8051 Family history of malignant neoplasm of kidney: Secondary | ICD-10-CM | POA: Diagnosis not present

## 2022-01-22 DIAGNOSIS — Z79899 Other long term (current) drug therapy: Secondary | ICD-10-CM | POA: Diagnosis not present

## 2022-01-22 DIAGNOSIS — K219 Gastro-esophageal reflux disease without esophagitis: Secondary | ICD-10-CM | POA: Diagnosis not present

## 2022-01-22 LAB — CMP (CANCER CENTER ONLY)
ALT: 32 U/L (ref 0–44)
AST: 22 U/L (ref 15–41)
Albumin: 4.1 g/dL (ref 3.5–5.0)
Alkaline Phosphatase: 63 U/L (ref 38–126)
Anion gap: 5 (ref 5–15)
BUN: 15 mg/dL (ref 8–23)
CO2: 29 mmol/L (ref 22–32)
Calcium: 10 mg/dL (ref 8.9–10.3)
Chloride: 104 mmol/L (ref 98–111)
Creatinine: 0.77 mg/dL (ref 0.44–1.00)
GFR, Estimated: >60 mL/min (ref >60)
Glucose, Bld: 95 mg/dL (ref 70–99)
Potassium: 4.1 mmol/L (ref 3.5–5.1)
Sodium: 138 mmol/L (ref 135–145)
Total Bilirubin: 0.8 mg/dL (ref 0.3–1.2)
Total Protein: 6.6 g/dL (ref 6.5–8.1)

## 2022-01-22 LAB — CBC WITH DIFFERENTIAL (CANCER CENTER ONLY)
Abs Immature Granulocytes: 0 10*3/uL (ref 0.00–0.07)
Basophils Absolute: 0 10*3/uL (ref 0.0–0.1)
Basophils Relative: 1 %
Eosinophils Absolute: 0.1 10*3/uL (ref 0.0–0.5)
Eosinophils Relative: 2 %
HCT: 39.1 % (ref 36.0–46.0)
Hemoglobin: 13.6 g/dL (ref 12.0–15.0)
Immature Granulocytes: 0 %
Lymphocytes Relative: 32 %
Lymphs Abs: 1.3 10*3/uL (ref 0.7–4.0)
MCH: 31.7 pg (ref 26.0–34.0)
MCHC: 34.8 g/dL (ref 30.0–36.0)
MCV: 91.1 fL (ref 80.0–100.0)
Monocytes Absolute: 0.3 10*3/uL (ref 0.1–1.0)
Monocytes Relative: 7 %
Neutro Abs: 2.4 10*3/uL (ref 1.7–7.7)
Neutrophils Relative %: 58 %
Platelet Count: 267 10*3/uL (ref 150–400)
RBC: 4.29 MIL/uL (ref 3.87–5.11)
RDW: 12.1 % (ref 11.5–15.5)
WBC Count: 4.2 10*3/uL (ref 4.0–10.5)
nRBC: 0 % (ref 0.0–0.2)

## 2022-01-22 LAB — GENETIC SCREENING ORDER

## 2022-01-22 NOTE — Progress Notes (Signed)
[place] NOTE  Patient Care Team: Marin Olp, MD as PCP - General (Family Medicine) Mauro Kaufmann, RN as Oncology Nurse Navigator Rockwell Germany, RN as Oncology Nurse Navigator Nicholas Lose, MD as Consulting Physician (Hematology and Oncology) Rolm Bookbinder, MD as Consulting Physician (General Surgery) Gery Pray, MD as Consulting Physician (Radiation Oncology)  CHIEF COMPLAINTS/PURPOSE OF CONSULTATION:  Newly diagnosed left breast cancer  HISTORY OF PRESENTING ILLNESS:  Michelle Howell 67 y.o. female is here because of recent diagnosis of left breast cancer.  Patient screening mammogram that detected left breast distortion and mass measuring 0.9 cm 1 abnormal lymph node was detected on biopsy was benign and concordant.  Breast biopsy came back as grade 1 invasive ductal carcinoma that was ER/PR positive HER2 negative with a Ki-67 of less than 1%.  She was presented this morning to the multidisciplinary tumor board and she is here at the [facility] clinic to discuss treatment plan.  She is accompanied by her spouse.  I reviewed her records extensively and collaborated the history with the patient.  SUMMARY OF ONCOLOGIC HISTORY: Oncology History  Malignant neoplasm of upper-outer quadrant of left breast in female, estrogen receptor positive ([place])  01/10/2022 Initial Diagnosis   Screening mammogram detected left breast distortion and mass polyp-like position measuring 0.9 cm, 1 abnormal lymph node biopsy: Benign concordant, breast biopsy: Grade 1 IDC ER 100%, PR 90%, Ki-67 < 1%, HER2 2+ by IHC FISH negative   01/22/2022 Cancer Staging   Staging form: Breast, AJCC 8th Edition - Clinical: Stage IA (cT1b, cN0, cM0, G1, ER+, PR+, HER2-) - Signed by Nicholas Lose, MD on 01/22/2022 Stage prefix: Initial diagnosis Histologic grading system: 3 grade system      MEDICAL HISTORY:  Past Medical History:  Diagnosis Date   GERD (gastroesophageal reflux  disease)    Hypertension     SURGICAL HISTORY: Past Surgical History:  Procedure Laterality Date   BREAST BIOPSY Left 01/10/2022   [place] LT BREAST BX W LOC DEV 1ST LESION IMG BX [place] US GUIDE 01/10/2022 [facility] MAMMOGRAPHY   dental implants  1985   KNEE SURGERY  1982   left   SHOULDER SURGERY  1991   right    SOCIAL HISTORY: Social History   Socioeconomic History   Marital status: Married    Spouse name: Not on file   Number of children: Not on file   Years of education: Not on file   Highest education level: Not on file  Occupational History   Not on file  Tobacco Use   Smoking status: Never   Smokeless tobacco: Never  Vaping Use   Vaping Use: Never used  Substance and Sexual Activity   Alcohol use: Yes    Alcohol/week: 14.0 standard drinks of alcohol    Types: 14 Cans of beer per week   Drug use: No   Sexual activity: Not on file  Other Topics Concern   Not on file  Social History Narrative   Married      Geophysicist/field seismologist      Hobbies: tennis   Social Determinants of Health   Financial Resource Strain: Not on file  Food Insecurity: Not on file  Transportation Needs: Not on file  Physical Activity: Not on file  Stress: Not on file  Social Connections: Not on file  Intimate Partner Violence: Not on file    FAMILY HISTORY: Family History  Problem Relation Age of Onset   Hypertension Mother  Hyperlipidemia Mother    Heart disease Mother    Heart attack Mother    Heart disease Father    Hypertension Father    Heart failure Father    Heart attack Father    Diabetes Sister    CAD Sister        minor heart attack in her 58s   Kidney disease Sister        required transplant after severe c diff   Diabetes Brother    Cancer Brother        kidney   Heart disease Brother        died at 78    Other Brother        back issues   CAD Brother        age 26   Colon cancer Neg Hx    Colon polyps Neg Hx    Esophageal cancer Neg Hx    Rectal cancer Neg Hx     Stomach cancer Neg Hx     ALLERGIES:  is allergic to meperidine hcl and penicillins.  MEDICATIONS:  Current Outpatient Medications  Medication Sig Dispense Refill   amLODipine (NORVASC) 5 MG tablet Take 1 tablet (5 mg total) by mouth daily. 90 tablet 3   Ascorbic Acid (VITAMIN C) 1000 MG tablet Take 1,000 mg by mouth daily.     buPROPion (WELLBUTRIN XL) 300 MG 24 hr tablet TAKE 1 TABLET BY MOUTH DAILY 90 tablet 0   Cholecalciferol (VITAMIN D) 2000 UNITS tablet Take 2,000 Units by mouth daily.     Coenzyme Q10 (CO Q 10 PO) Take by mouth daily.     Omega 3-6-9 Fatty Acids (OMEGA-3-6-9 PO) Take by mouth daily.     omeprazole (PRILOSEC) 20 MG capsule Take 1 capsule (20 mg total) by mouth daily. 90 capsule 1   PINE BARK, PYCNOGENOL, PO Take by mouth daily.     potassium chloride (MICRO-K) 10 MEQ CR capsule Take 10 mEq by mouth 2 (two) times daily.     Red Yeast Rice Extract (RED YEAST RICE PO) Take by mouth daily.     rosuvastatin (CRESTOR) 10 MG tablet Take 1 tablet (10 mg total) by mouth daily. 90 tablet 3   No current facility-administered medications for this visit.    REVIEW OF SYSTEMS:   Constitutional: Denies fevers, chills or abnormal night sweats   All other systems were reviewed with the patient and are negative.  PHYSICAL EXAMINATION: ECOG PERFORMANCE STATUS: 1 - Symptomatic but completely ambulatory  Vitals:   01/22/22 0910  BP: (!) 153/94  Pulse: 69  Resp: 15  Temp: (!) 97 F (36.1 C)  SpO2: 100%   Filed Weights   01/22/22 0910  Weight: 178 lb 12.8 oz (81.1 kg)    GENERAL:alert, no distress and comfortable    LABORATORY DATA:  I have reviewed the data as listed Lab Results  Component Value Date   WBC 4.2 01/22/2022   HGB 13.6 01/22/2022   HCT 39.1 01/22/2022   MCV 91.1 01/22/2022   PLT 267 01/22/2022   Lab Results  Component Value Date   NA 138 01/22/2022   K 4.1 01/22/2022   CL 104 01/22/2022   CO2 29 01/22/2022    RADIOGRAPHIC  STUDIES: I have personally reviewed the radiological reports and agreed with the findings in the report.  ASSESSMENT AND PLAN:  Malignant neoplasm of upper-outer quadrant of left breast in female, estrogen receptor positive ([place]) 01/10/2022:Screening mammogram detected left breast distortion and  mass polyp-like position measuring 0.9 cm, 1 abnormal lymph node biopsy: Benign concordant, breast biopsy: Grade 1 IDC ER 100%, PR 90%, Ki-67 < 1%, HER2 2+ by IHC FISH negative  Pathology and radiology counseling:Discussed with the patient, the details of pathology including the type of breast cancer,the clinical staging, the significance of ER, PR and HER-2/neu receptors and the implications for treatment. After reviewing the pathology in detail, we proceeded to discuss the different treatment options between surgery, radiation, chemotherapy, antiestrogen therapies.  Recommendations: 1. Breast conserving surgery followed by 2. Oncotype DX testing to determine if chemotherapy would be of any benefit followed by 3. Adjuvant radiation therapy followed by 4. Adjuvant antiestrogen therapy  Oncotype counseling: I discussed Oncotype DX test. I explained to the patient that this is a 21 gene panel to evaluate patient tumors DNA to calculate recurrence score. This would help determine whether patient has high risk or low risk breast cancer. She understands that if her tumor was found to be high risk, she would benefit from systemic chemotherapy. If low risk, no need of chemotherapy.  Return to clinic after surgery to discuss final pathology report and then determine if Oncotype DX testing will need to be sent.   All questions were answered. The patient knows to call the clinic with any problems, questions or concerns.    Harriette Ohara, MD 01/22/22

## 2022-01-22 NOTE — Assessment & Plan Note (Signed)
01/10/2022:Screening mammogram detected left breast distortion and mass polyp-like position measuring 0.9 cm, 1 abnormal lymph node biopsy: Benign concordant, breast biopsy: Grade 1 IDC ER 100%, PR 90%, Ki-67 < 1%, HER2 2+ by IHC FISH negative  Pathology and radiology counseling:Discussed with the patient, the details of pathology including the type of breast cancer,the clinical staging, the significance of ER, PR and HER-2/neu receptors and the implications for treatment. After reviewing the pathology in detail, we proceeded to discuss the different treatment options between surgery, radiation, chemotherapy, antiestrogen therapies.  Recommendations: 1. Breast conserving surgery followed by 2. Oncotype DX testing to determine if chemotherapy would be of any benefit followed by 3. Adjuvant radiation therapy followed by 4. Adjuvant antiestrogen therapy  Oncotype counseling: I discussed Oncotype DX test. I explained to the patient that this is a 21 gene panel to evaluate patient tumors DNA to calculate recurrence score. This would help determine whether patient has high risk or low risk breast cancer. She understands that if her tumor was found to be high risk, she would benefit from systemic chemotherapy. If low risk, no need of chemotherapy.  Return to clinic after surgery to discuss final pathology report and then determine if Oncotype DX testing will need to be sent.

## 2022-01-22 NOTE — Research (Signed)
Trial:  [employer] 2021-05 - Specimen Collection Study to Evaluate Biomarkers in Subjects with Cancer   Patient Michelle Howell was identified by Dr. Lindi Adie as a potential candidate for the above listed study.  This Clinical Research Nurse met with Michelle Howell, [id number], on 01/22/22 in a manner and location that ensures patient privacy to discuss participation in the above listed research study.  Patient is Accompanied by her wife, [name] .  A copy of the informed consent document with embedded HIPAA language was provided to the patient.  Patient reads, speaks, and understands [place].   Patient was provided with the business card of this Nurse and encouraged to contact the research team with any questions.  Approximately 10 minutes were spent with the patient reviewing the informed consent documents.  Patient was provided the option of taking informed consent documents home to review and was encouraged to review at their convenience with their support network, including other care providers. Patient took the consent documents home to review. Patient agreed to call from research nurse on Monday 01/27/22 and she will call research sooner if she has any questions or wants to participate in this study before next week. Foye Spurling, BSN, RN, [place] Nurse II [phone] 01/22/2022 12:15 PM

## 2022-01-22 NOTE — Progress Notes (Signed)
[place] Work  Initial Assessment   Michelle Howell is a 67 y.o. year old female accompanied by wife, [name]. Clinical Social Work was referred by  [facility]  for assessment of psychosocial needs.   SDOH (Social Determinants of Health) assessments performed: Yes SDOH Interventions    Flowsheet Row Clinical Support from 01/22/2022 in [facility] [facility]  SDOH Interventions   Food Insecurity Interventions Intervention Not Indicated  Housing Interventions Intervention Not Indicated  Transportation Interventions Intervention Not Indicated  Financial Strain Interventions Other (Comment)  [cancer foundations]       SDOH Screenings   Food Insecurity: No Food Insecurity (01/22/2022)  Housing: Low Risk  (01/22/2022)  Transportation Needs: No Transportation Needs (01/22/2022)  Depression (PHQ2-9): Low Risk  (05/30/2021)  Financial Resource Strain: Medium Risk (01/22/2022)  Tobacco Use: Low Risk  (01/22/2022)     Distress Screen completed: Yes    01/22/2022   11:11 AM  ONCBCN DISTRESS SCREENING  Screening Type Initial Screening  Distress experienced in past week (1-10) 1  Physical Problem type Pain      Family/Social Information:  Housing Arrangement: patient lives with wife Family members/support persons in your life? 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  Income source: Employment Financial concerns: Yes, due to illness and/or loss of work during treatment Type of concern: Medical bills Food access concerns: no Religious or spiritual practice: Not known Services Currently in place:  potentially has [employer] plan- pt looking into this  Coping/ Adjustment to diagnosis: Patient understands treatment plan and what happens next? yes, most concerned about what her overall cost will be Concerns about diagnosis and/or treatment: How I will pay for the services I  need Patient reported stressors: Insurance and Work/ school Current coping skills/ strengths: Capable of independent living , Motivation for treatment/growth , and Supportive family/friends     SUMMARY: Current SDOH Barriers:  Financial constraints related to upcoming loss of work during treatment  Clinical Social Work Clinical Goal(s):  Freight forwarder options for unmet needs related to:  Financial Strain   Interventions: Discussed common feeling and emotions when being diagnosed with cancer, and the importance of support during treatment Informed patient of the support team roles and support services at [facility] Provided [place] contact information and encouraged patient to call with any questions or concerns Provided education regarding OOP maximums and cancer foundations for financial assistance   Follow Up Plan:  CSW mailing information on cancer foundations to patient. Pt will contact CSW with questions or for assistance completing applications Patient verbalizes understanding of plan: Yes    Zain Lankford E Faustino Luecke, LCSW

## 2022-01-22 NOTE — Progress Notes (Signed)
REFERRING PROVIDER: Nicholas Lose, MD [place],  [place] [id number]   PRIMARY PROVIDER:  Marin Olp, MD  PRIMARY REASON FOR VISIT:  1. Malignant neoplasm of upper-outer quadrant of left breast in female, estrogen receptor positive ([place])   2. Family history of kidney cancer   3. Family history of brain cancer      HISTORY OF PRESENT ILLNESS:   Ms. Santillano, a 67 y.o. female, was seen for a [place] cancer genetics consultation at the request of Dr. Lindi Adie due to a personal history of breast cancer.  Ms. Mauro presents to clinic today to discuss the possibility of a hereditary predisposition to cancer, to discuss genetic testing, and to further clarify her future cancer risks, as well as potential cancer risks for family members.   In January 2024, at the age of 30, Ms. Sutphin was diagnosed with invasive ductal carcinoma of the left breast (ER+/PR+/HER2-). The treatment plan is pending.     CANCER HISTORY:  Oncology History  Malignant neoplasm of upper-outer quadrant of left breast in female, estrogen receptor positive ([place])  01/10/2022 Initial Diagnosis   Screening mammogram detected left breast distortion and mass polyp-like position measuring 0.9 cm, 1 abnormal lymph node biopsy: Benign concordant, breast biopsy: Grade 1 IDC ER 100%, PR 90%, Ki-67 < 1%, HER2 2+ by IHC FISH negative   01/22/2022 Cancer Staging   Staging form: Breast, AJCC 8th Edition - Clinical: Stage IA (cT1b, cN0, cM0, G1, ER+, PR+, HER2-) - Signed by Nicholas Lose, MD on 01/22/2022 Stage prefix: Initial diagnosis Histologic grading system: 3 grade system       Past Medical History:  Diagnosis Date   GERD (gastroesophageal reflux disease)    Hypertension     Past Surgical History:  Procedure Laterality Date   BREAST BIOPSY Left 01/10/2022   [place] LT BREAST BX W LOC DEV 1ST LESION IMG BX SPEC US GUIDE 01/10/2022 [facility] MAMMOGRAPHY   dental implants  1985   KNEE SURGERY  1982    left   SHOULDER SURGERY  1991   right    FAMILY HISTORY:  We obtained a detailed, 4-generation family history.  Significant diagnoses are listed below: Family History  Problem Relation Age of Onset   Kidney cancer Brother        dx early 67s   Brain cancer Paternal Aunt        d. after 52   Kidney cancer Cousin        paternal female cousin; dx after 76     Ms. Lupinacci is unaware of previous family history of genetic testing for hereditary cancer risks. There is no reported Ashkenazi Jewish ancestry. There is no known consanguinity.  GENETIC COUNSELING ASSESSMENT: Ms. Barna is a 67 y.o. female with a family history of kidney cancer which is somewhat suggestive of a hereditary cancer syndrome and predisposition to cancer given the ages of diagnosis in the family. We, therefore, discussed and recommended the following at today's visit.   DISCUSSION: We discussed that 5 - 10% of cancer is hereditary.  Hereditary kidney cancer be associated with mutations in genes including but not limited to FH, FLCN, MET,  VHL, and TSC1/2 .  There are other genes that can be associated with hereditary breast cancer syndrome, such as BRCA1/2.  We discussed that testing is beneficial for several reasons including knowing how to follow individuals for their cancer risks and understanding if other family members could be at risk for cancer and  allowing them to undergo genetic testing.   We reviewed the characteristics, features and inheritance patterns of hereditary cancer syndromes. We also discussed genetic testing, including the appropriate family members to test, the process of testing, insurance coverage and turn-around-time for results. We discussed the implications of a negative, positive, carrier and/or variant of uncertain significant result. We recommended Ms. Olgin pursue genetic testing for a panel that includes genes associated with breast cancer and kidney cancer.   The Multi-Cancer + RNA Panel  offered by [facility] includes sequencing and/or deletion/duplication analysis of the following 70 genes:  AIP*, ALK, APC*, ATM*, AXIN2*, BAP1*, BARD1*, BLM*, BMPR1A*, BRCA1*, BRCA2*, BRIP1*, CDC73*, CDH1*, CDK4, CDKN1B*, CDKN2A, CHEK2*, CTNNA1*, DICER1*, EPCAM (del/dup only), EGFR, FH*, FLCN*, GREM1 (promoter dup only), HOXB13, KIT, LZTR1, MAX*, MBD4, MEN1*, MET, MITF, MLH1*, MSH2*, MSH3*, MSH6*, MUTYH*, NF1*, NF2*, NTHL1*, PALB2*, PDGFRA, PMS2*, POLD1*, POLE*, POT1*, PRKAR1A*, PTCH1*, PTEN*, RAD51C*, RAD51D*, RB1*, RET, SDHA* (sequencing only), SDHAF2*, SDHB*, SDHC*, SDHD*, SMAD4*, SMARCA4*, SMARCB1*, SMARCE1*, STK11*, SUFU*, TMEM127*, TP53*, TSC1*, TSC2*, VHL*. RNA analysis is performed for * genes.  Based on Ms. Viramontes's personal and family history of cancer, she meets medical criteria for genetic testing. Despite that she meets criteria, she may still have an out of pocket cost. We discussed that if her out of pocket cost for testing is over $100, the laboratory should contact her and discuss the self-pay prices and/or patient pay assistance programs.    PLAN: After considering the risks, benefits, and limitations, Ms. Harnack provided informed consent to pursue genetic testing and the blood sample was sent to [facility] for analysis of the Multi-Cancer +RNA Panel. Results should be available within approximately 3 weeks' time, at which point they will be disclosed by telephone to Ms. Weckwerth, as will any additional recommendations warranted by these results. Ms. Fabel will receive a summary of her genetic counseling visit and a copy of her results once available. This information will also be available in Epic.   Ms. Cuthrell questions were answered to her satisfaction today. Our contact information was provided should additional questions or concerns arise. Thank you for the referral and allowing [place] to share in the care of your patient.   Antonya Leeder M. Joette Catching, [place], [facility] Genetic  Counselor [email or web address] (P) [phone]  The patient was seen for a total of 15 minutes in face-to-face genetic counseling.  The patient was accompanied by her wife. Drs. Lindi Adie and/or Burr Medico were available to discuss this case as needed.    _______________________________________________________________________ For Office Staff:  Number of people involved in session: 2 Was an Intern/ student involved with case: yes; pharmacy student

## 2022-01-27 ENCOUNTER — Other Ambulatory Visit: Payer: Self-pay | Admitting: General Surgery

## 2022-01-27 ENCOUNTER — Telehealth: Payer: Self-pay

## 2022-01-27 DIAGNOSIS — C50412 Malignant neoplasm of upper-outer quadrant of left female breast: Secondary | ICD-10-CM

## 2022-01-27 NOTE — Telephone Encounter (Signed)
[employer] 2021-05 - Specimen Collection Study to Evaluate Biomarkers in Subjects with Cancer   Called pt to follow up on interest in enrolling in study. She has not gotten a chance to review consent but will do so and call me back towards the end of this week/beginning of next week. She has her surgery scheduled in 2 weeks, and I confirmed with her that she would need to come in before that.  Marjie Skiff Anahita Cua, RN, BSN, [facility] She  Her  Hers Clinical Research Nurse [facility] Direct Dial [phone]  Pager [phone] 01/27/2022 1:22 PM

## 2022-01-30 ENCOUNTER — Telehealth: Payer: Self-pay | Admitting: *Deleted

## 2022-01-30 ENCOUNTER — Encounter: Payer: Self-pay | Admitting: *Deleted

## 2022-01-30 NOTE — Telephone Encounter (Signed)
Left message for a return phone call to follow up from [facility] 1/17 and assess navigation needs.

## 2022-01-31 ENCOUNTER — Other Ambulatory Visit: Payer: Self-pay

## 2022-01-31 ENCOUNTER — Encounter (HOSPITAL_BASED_OUTPATIENT_CLINIC_OR_DEPARTMENT_OTHER): Payer: Self-pay | Admitting: General Surgery

## 2022-02-03 ENCOUNTER — Encounter (HOSPITAL_BASED_OUTPATIENT_CLINIC_OR_DEPARTMENT_OTHER)
Admission: RE | Admit: 2022-02-03 | Discharge: 2022-02-03 | Disposition: A | Discharge status: Home / Self Care | Payer: PPO | Source: Ambulatory Visit | Attending: General Surgery | Admitting: General Surgery

## 2022-02-03 ENCOUNTER — Encounter: Payer: Self-pay | Admitting: Genetic Counselor

## 2022-02-03 ENCOUNTER — Telehealth: Payer: Self-pay | Admitting: Genetic Counselor

## 2022-02-03 DIAGNOSIS — Z01810 Encounter for preprocedural cardiovascular examination: Secondary | ICD-10-CM | POA: Diagnosis not present

## 2022-02-03 DIAGNOSIS — Z1379 Encounter for other screening for genetic and chromosomal anomalies: Secondary | ICD-10-CM | POA: Insufficient documentation

## 2022-02-03 MED ORDER — ENSURE PRE-SURGERY PO LIQD: 296.0000 mL | Freq: Once | ORAL | Status: DC

## 2022-02-03 NOTE — Progress Notes (Signed)

## 2022-02-03 NOTE — Telephone Encounter (Signed)
Contacted patient in attempt to disclose results of genetic testing.  LVM with contact information requesting a call back.

## 2022-02-04 ENCOUNTER — Telehealth: Payer: Self-pay | Admitting: *Deleted

## 2022-02-04 ENCOUNTER — Telehealth: Payer: Self-pay | Admitting: Genetic Counselor

## 2022-02-04 ENCOUNTER — Ambulatory Visit: Payer: Self-pay | Admitting: Genetic Counselor

## 2022-02-04 DIAGNOSIS — Z8051 Family history of malignant neoplasm of kidney: Secondary | ICD-10-CM

## 2022-02-04 DIAGNOSIS — Z1379 Encounter for other screening for genetic and chromosomal anomalies: Secondary | ICD-10-CM

## 2022-02-04 DIAGNOSIS — Z170 Estrogen receptor positive status [ER+]: Secondary | ICD-10-CM

## 2022-02-04 DIAGNOSIS — Z808 Family history of malignant neoplasm of other organs or systems: Secondary | ICD-10-CM

## 2022-02-04 NOTE — Telephone Encounter (Signed)
Revealed negative genetics.   

## 2022-02-04 NOTE — Telephone Encounter (Signed)
Received message from patient regarding seed placement on Thursday. 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 Informed her I would find out call her back.

## 2022-02-05 DIAGNOSIS — Z170 Estrogen receptor positive status [ER+]: Secondary | ICD-10-CM

## 2022-02-05 NOTE — Research (Signed)
[employer] 2021-05 - Specimen Collection Study to Evaluate Biomarkers in Subjects with Cancer   Patient did not return call to research in time to enroll in ES2021-05 prior to seeding tomorrow. She is now ineligible for ES2021-05.  Marjie Skiff Fronia Depass, RN, BSN, [facility] She  Her  Hers Clinical Research Nurse [facility] Direct Dial [phone]  Pager [phone] 02/05/2022 9:47 AM

## 2022-02-05 NOTE — Progress Notes (Signed)
HPI:   Ms. Timmons was previously seen in the [facility] due to a personal and family history of cancer and concerns regarding a hereditary predisposition to cancer. Please refer to our prior cancer genetics clinic note for more information regarding our discussion, assessment and recommendations, at the time. Ms. Tygart recent genetic test results were disclosed to her, as were recommendations warranted by these results. These results and recommendations are discussed in more detail below.  CANCER HISTORY:  Oncology History  Malignant neoplasm of upper-outer quadrant of left breast in female, estrogen receptor positive ([place])  01/10/2022 Initial Diagnosis   Screening mammogram detected left breast distortion and mass polyp-like position measuring 0.9 cm, 1 abnormal lymph node biopsy: Benign concordant, breast biopsy: Grade 1 IDC ER 100%, PR 90%, Ki-67 < 1%, HER2 2+ by IHC FISH negative   01/22/2022 Cancer Staging   Staging form: Breast, AJCC 8th Edition - Clinical: Stage IA (cT1b, cN0, cM0, G1, ER+, PR+, HER2-) - Signed by Nicholas Lose, MD on 01/22/2022 Stage prefix: Initial diagnosis Histologic grading system: 3 grade system   02/03/2022 Genetic Testing   Negative [facility] Multi-Cancer +RNA Panel.  Report date is 02/03/2022.   The Multi-Cancer + RNA Panel offered by [facility] includes sequencing and/or deletion/duplication analysis of the following 70 genes:  AIP*, ALK, APC*, ATM*, AXIN2*, BAP1*, BARD1*, BLM*, BMPR1A*, BRCA1*, BRCA2*, BRIP1*, CDC73*, CDH1*, CDK4, CDKN1B*, CDKN2A, CHEK2*, CTNNA1*, DICER1*, EPCAM (del/dup only), EGFR, FH*, FLCN*, GREM1 (promoter dup only), HOXB13, KIT, LZTR1, MAX*, MBD4, MEN1*, MET, MITF, MLH1*, MSH2*, MSH3*, MSH6*, MUTYH*, NF1*, NF2*, NTHL1*, PALB2*, PDGFRA, PMS2*, POLD1*, POLE*, POT1*, PRKAR1A*, PTCH1*, PTEN*, RAD51C*, RAD51D*, RB1*, RET, SDHA* (sequencing only), SDHAF2*, SDHB*, SDHC*, SDHD*, SMAD4*, SMARCA4*, SMARCB1*, SMARCE1*, STK11*, SUFU*,  TMEM127*, TP53*, TSC1*, TSC2*, VHL*. RNA analysis is performed for * genes.     FAMILY HISTORY:  We obtained a detailed, 4-generation family history.  Significant diagnoses are listed below:      Family History  Problem Relation Age of Onset   Kidney cancer Brother          dx early 39s   Brain cancer Paternal Aunt          d. after 85   Kidney cancer Cousin          paternal female cousin; dx after 69       Ms. Garretson is unaware of previous family history of genetic testing for hereditary cancer risks. There is no reported Ashkenazi Jewish ancestry. There is no known consanguinity.    GENETIC TEST RESULTS:  The [facility] Multi-Cancer +RNA Panel found no pathogenic mutations.   The Multi-Cancer + RNA Panel offered by [facility] includes sequencing and/or deletion/duplication analysis of the following 70 genes:  AIP*, ALK, APC*, ATM*, AXIN2*, BAP1*, BARD1*, BLM*, BMPR1A*, BRCA1*, BRCA2*, BRIP1*, CDC73*, CDH1*, CDK4, CDKN1B*, CDKN2A, CHEK2*, CTNNA1*, DICER1*, EPCAM (del/dup only), EGFR, FH*, FLCN*, GREM1 (promoter dup only), HOXB13, KIT, LZTR1, MAX*, MBD4, MEN1*, MET, MITF, MLH1*, MSH2*, MSH3*, MSH6*, MUTYH*, NF1*, NF2*, NTHL1*, PALB2*, PDGFRA, PMS2*, POLD1*, POLE*, POT1*, PRKAR1A*, PTCH1*, PTEN*, RAD51C*, RAD51D*, RB1*, RET, SDHA* (sequencing only), SDHAF2*, SDHB*, SDHC*, SDHD*, SMAD4*, SMARCA4*, SMARCB1*, SMARCE1*, STK11*, SUFU*, TMEM127*, TP53*, TSC1*, TSC2*, VHL*. RNA analysis is performed for * genes. .   The test report has been scanned into EPIC and is located under the Molecular Pathology section of the Results Review tab.  A portion of the result report is included below for reference. Genetic testing reported out on February 03, 2022.  Even though a pathogenic variant was not identified, possible explanations for the cancer in the family may include: There may be no hereditary risk for cancer in the family. The cancers in Ms. Connolly and/or her family may be sporadic/familial or  due to other genetic and environmental factors. There may be a gene mutation in one of these genes that current testing methods cannot detect but that chance is small. There could be another gene that has not yet been discovered, or that we have not yet tested, that is responsible for the cancer diagnoses in the family.  It is also possible there is a hereditary cause for the cancer in the family that Ms. Horrell did not inherit.   Therefore, it is important to remain in touch with cancer genetics in the future so that we can continue to offer Ms. Boettger the most up to date genetic testing.     ADDITIONAL GENETIC TESTING:  We discussed with Ms. Quarry that her genetic testing was fairly extensive.  If there are additional relevant genes identified to increase cancer risk that can be analyzed in the future, we would be happy to discuss and coordinate this testing at that time.      CANCER SCREENING RECOMMENDATIONS:  Ms. Willow test result is considered negative (normal).  This means that we have not identified a hereditary cause for her personal history of breast cancer at this time.    An individual's cancer risk and medical management are not determined by genetic test results alone. Overall cancer risk assessment incorporates additional factors, including personal medical history, family history, and any available genetic information that may result in a personalized plan for cancer prevention and surveillance. Therefore, it is recommended she continue to follow the cancer management and screening guidelines provided by her oncology and primary healthcare provider.  RECOMMENDATIONS FOR FAMILY MEMBERS:   Individuals in this family might be at some increased risk of developing cancer, over the general population risk, due to the family history of cancer.  Individuals in the family should notify their providers of the family history of cancer. We recommend women in this family have a yearly  mammogram beginning at age 31, or 33 years younger than the earliest onset of cancer, an annual clinical breast exam, and perform monthly breast self-exams.  Other members of the family may still carry a pathogenic variant in one of these genes that Ms. Jaimes did not inherit. Based on the family history of her brother with kidney cancer in his 64s, we recommend her siblings and this brother's children have genetic counseling and testing. Ms. Sonntag will let us know if we can be of any assistance in coordinating genetic counseling and/or testing for these family members.     FOLLOW-UP:  Lastly, we discussed with Ms. Mcnamara that cancer genetics is a rapidly advancing field and it is possible that new genetic tests will be appropriate for her and/or her family members in the future. We encouraged her to remain in contact with cancer genetics on an annual basis so we can update her personal and family histories and let her know of advances in cancer genetics that may benefit this family.   Our contact number was provided. Ms. Storey questions were answered to her satisfaction, and she knows she is welcome to call us at anytime with additional questions or concerns.   Charly Hunton M. Joette Catching, [place], [facility] Genetic Counselor [email or web address] (P) [phone]

## 2022-02-06 ENCOUNTER — Ambulatory Visit
Admission: RE | Admit: 2022-02-06 | Discharge: 2022-02-06 | Disposition: A | Discharge status: Home / Self Care | Payer: PPO | Source: Ambulatory Visit | Attending: General Surgery | Admitting: General Surgery

## 2022-02-06 DIAGNOSIS — Z170 Estrogen receptor positive status [ER+]: Secondary | ICD-10-CM

## 2022-02-06 DIAGNOSIS — R928 Other abnormal and inconclusive findings on diagnostic imaging of breast: Secondary | ICD-10-CM | POA: Diagnosis not present

## 2022-02-06 HISTORY — PX: BREAST BIOPSY: SHX20

## 2022-02-09 NOTE — Anesthesia Preprocedure Evaluation (Signed)
Anesthesia Evaluation  Patient identified by MRN, date of birth, ID band Patient awake    Reviewed: Allergy & Precautions, NPO status , Patient's Chart, lab work & pertinent test results  Airway Mallampati: II  TM Distance: >3 FB Neck ROM: Full    Dental no notable dental hx. (+) Teeth Intact, Dental Advisory Given, Implants   Pulmonary neg pulmonary ROS   Pulmonary exam normal breath sounds clear to auscultation       Cardiovascular hypertension, Normal cardiovascular exam Rhythm:Regular Rate:Normal     Neuro/Psych    GI/Hepatic ,GERD  Controlled,,  Endo/Other    Renal/GU      Musculoskeletal  (+) Arthritis ,    Abdominal   Peds  Hematology   Anesthesia Other Findings All: Meperidine, PCNS  Reproductive/Obstetrics                             Anesthesia Physical Anesthesia Plan  ASA: 3  Anesthesia Plan: General   Post-op Pain Management: Precedex and Tylenol PO (pre-op)*   Induction: Intravenous  PONV Risk Score and Plan: TIVA, Propofol infusion, Midazolam, Treatment may vary due to age or medical condition and Ondansetron  Airway Management Planned: LMA  Additional Equipment: None  Intra-op Plan:   Post-operative Plan:   Informed Consent: I have reviewed the patients History and Physical, chart, labs and discussed the procedure including the risks, benefits and alternatives for the proposed anesthesia with the patient or authorized representative who has indicated his/her understanding and acceptance.     Dental advisory given  Plan Discussed with: CRNA, Surgeon and Anesthesiologist  Anesthesia Plan Comments: (LMA TIVA GA)       Anesthesia Quick Evaluation

## 2022-02-10 ENCOUNTER — Encounter (HOSPITAL_BASED_OUTPATIENT_CLINIC_OR_DEPARTMENT_OTHER): Payer: Self-pay | Admitting: General Surgery

## 2022-02-10 ENCOUNTER — Ambulatory Visit (HOSPITAL_BASED_OUTPATIENT_CLINIC_OR_DEPARTMENT_OTHER): Payer: PPO | Admitting: Anesthesiology

## 2022-02-10 ENCOUNTER — Encounter (HOSPITAL_BASED_OUTPATIENT_CLINIC_OR_DEPARTMENT_OTHER)
Admission: RE | Disposition: A | Discharge status: Home / Self Care | Payer: Self-pay | Source: Home / Self Care | Attending: General Surgery

## 2022-02-10 ENCOUNTER — Ambulatory Visit (HOSPITAL_BASED_OUTPATIENT_CLINIC_OR_DEPARTMENT_OTHER)
Admission: RE | Admit: 2022-02-10 | Discharge: 2022-02-10 | Disposition: A | Discharge status: Home / Self Care | Payer: PPO | Attending: General Surgery | Admitting: General Surgery

## 2022-02-10 ENCOUNTER — Ambulatory Visit
Admission: RE | Admit: 2022-02-10 | Discharge: 2022-02-10 | Disposition: A | Discharge status: Home / Self Care | Payer: PPO | Source: Ambulatory Visit | Attending: General Surgery | Admitting: General Surgery

## 2022-02-10 ENCOUNTER — Other Ambulatory Visit: Payer: Self-pay

## 2022-02-10 DIAGNOSIS — Z170 Estrogen receptor positive status [ER+]: Secondary | ICD-10-CM | POA: Diagnosis not present

## 2022-02-10 DIAGNOSIS — C50912 Malignant neoplasm of unspecified site of left female breast: Secondary | ICD-10-CM | POA: Diagnosis not present

## 2022-02-10 DIAGNOSIS — C50412 Malignant neoplasm of upper-outer quadrant of left female breast: Secondary | ICD-10-CM | POA: Insufficient documentation

## 2022-02-10 DIAGNOSIS — I10 Essential (primary) hypertension: Secondary | ICD-10-CM | POA: Insufficient documentation

## 2022-02-10 DIAGNOSIS — M19072 Primary osteoarthritis, left ankle and foot: Secondary | ICD-10-CM | POA: Diagnosis not present

## 2022-02-10 DIAGNOSIS — M1990 Unspecified osteoarthritis, unspecified site: Secondary | ICD-10-CM

## 2022-02-10 DIAGNOSIS — Z01818 Encounter for other preprocedural examination: Secondary | ICD-10-CM

## 2022-02-10 HISTORY — PX: BREAST LUMPECTOMY WITH RADIOACTIVE SEED LOCALIZATION: SHX6424

## 2022-02-10 SURGERY — BREAST LUMPECTOMY WITH RADIOACTIVE SEED LOCALIZATION
Anesthesia: General | Site: Breast | Laterality: Left

## 2022-02-10 MED ORDER — ONDANSETRON HCL 4 MG/2ML IJ SOLN
INTRAMUSCULAR | Status: AC
  Filled 2022-02-10: qty 2

## 2022-02-10 MED ORDER — OXYCODONE HCL 5 MG PO TABS
ORAL_TABLET | ORAL | Status: AC
  Filled 2022-02-10: qty 1

## 2022-02-10 MED ORDER — DEXAMETHASONE SODIUM PHOSPHATE 4 MG/ML IJ SOLN
INTRAMUSCULAR | Status: DC | PRN
  Administered 2022-02-10: 8 mg via INTRAVENOUS

## 2022-02-10 MED ORDER — LACTATED RINGERS IV SOLN: INTRAVENOUS | Status: DC

## 2022-02-10 MED ORDER — BUPIVACAINE HCL (PF) 0.25 % IJ SOLN
INTRAMUSCULAR | Status: DC | PRN
  Administered 2022-02-10: 10 mL

## 2022-02-10 MED ORDER — LIDOCAINE 2% (20 MG/ML) 5 ML SYRINGE
INTRAMUSCULAR | Status: AC
  Filled 2022-02-10: qty 5

## 2022-02-10 MED ORDER — ACETAMINOPHEN 10 MG/ML IV SOLN: 1000.0000 mg | Freq: Once | INTRAVENOUS | Status: DC | PRN

## 2022-02-10 MED ORDER — CHLORHEXIDINE GLUCONATE CLOTH 2 % EX PADS: 6.0000 | MEDICATED_PAD | Freq: Once | CUTANEOUS | Status: DC

## 2022-02-10 MED ORDER — PROPOFOL 10 MG/ML IV BOLUS
INTRAVENOUS | Status: DC | PRN
  Administered 2022-02-10: 150 mg via INTRAVENOUS

## 2022-02-10 MED ORDER — PROPOFOL 500 MG/50ML IV EMUL
INTRAVENOUS | Status: AC
  Filled 2022-02-10: qty 50

## 2022-02-10 MED ORDER — CEFAZOLIN SODIUM-DEXTROSE 2-4 GM/100ML-% IV SOLN
2.0000 g | INTRAVENOUS | Status: AC
  Administered 2022-02-10: 2 g via INTRAVENOUS

## 2022-02-10 MED ORDER — OXYCODONE HCL 5 MG PO TABS
5.0000 mg | ORAL_TABLET | Freq: Once | ORAL | Status: AC
  Administered 2022-02-10: 5 mg via ORAL

## 2022-02-10 MED ORDER — DEXAMETHASONE SODIUM PHOSPHATE 10 MG/ML IJ SOLN
INTRAMUSCULAR | Status: AC
  Filled 2022-02-10: qty 1

## 2022-02-10 MED ORDER — FENTANYL CITRATE (PF) 100 MCG/2ML IJ SOLN: 25.0000 ug | INTRAMUSCULAR | Status: DC | PRN

## 2022-02-10 MED ORDER — MIDAZOLAM HCL 2 MG/2ML IJ SOLN
INTRAMUSCULAR | Status: AC
  Filled 2022-02-10: qty 2

## 2022-02-10 MED ORDER — FENTANYL CITRATE (PF) 100 MCG/2ML IJ SOLN
INTRAMUSCULAR | Status: AC
  Filled 2022-02-10: qty 2

## 2022-02-10 MED ORDER — LIDOCAINE HCL (CARDIAC) PF 100 MG/5ML IV SOSY
PREFILLED_SYRINGE | INTRAVENOUS | Status: DC | PRN
  Administered 2022-02-10: 100 mg via INTRAVENOUS

## 2022-02-10 MED ORDER — CEFAZOLIN SODIUM-DEXTROSE 2-4 GM/100ML-% IV SOLN
INTRAVENOUS | Status: AC
  Filled 2022-02-10: qty 100

## 2022-02-10 MED ORDER — MIDAZOLAM HCL 5 MG/5ML IJ SOLN
INTRAMUSCULAR | Status: DC | PRN
  Administered 2022-02-10: 2 mg via INTRAVENOUS

## 2022-02-10 MED ORDER — PROPOFOL 500 MG/50ML IV EMUL
INTRAVENOUS | Status: DC | PRN
  Administered 2022-02-10: 200 ug/kg/min via INTRAVENOUS

## 2022-02-10 MED ORDER — ACETAMINOPHEN 500 MG PO TABS
1000.0000 mg | ORAL_TABLET | ORAL | Status: AC
  Administered 2022-02-10: 1000 mg via ORAL

## 2022-02-10 MED ORDER — FENTANYL CITRATE (PF) 100 MCG/2ML IJ SOLN
INTRAMUSCULAR | Status: DC | PRN
  Administered 2022-02-10: 50 ug via INTRAVENOUS

## 2022-02-10 MED ORDER — ONDANSETRON HCL 4 MG/2ML IJ SOLN: 4.0000 mg | Freq: Once | INTRAMUSCULAR | Status: DC | PRN

## 2022-02-10 MED ORDER — ONDANSETRON HCL 4 MG/2ML IJ SOLN
INTRAMUSCULAR | Status: DC | PRN
  Administered 2022-02-10: 4 mg via INTRAVENOUS

## 2022-02-10 MED ORDER — ACETAMINOPHEN 500 MG PO TABS
ORAL_TABLET | ORAL | Status: AC
  Filled 2022-02-10: qty 2

## 2022-02-10 SURGICAL SUPPLY — 50 items
ADH SKN CLS APL DERMABOND .7 (GAUZE/BANDAGES/DRESSINGS) ×1
APL PRP STRL LF DISP 70% ISPRP (MISCELLANEOUS) ×1
BINDER BREAST LRG (GAUZE/BANDAGES/DRESSINGS) IMPLANT
BINDER BREAST XLRG (GAUZE/BANDAGES/DRESSINGS) IMPLANT
BLADE SURG 15 STRL LF DISP TIS (BLADE) ×1 IMPLANT
BLADE SURG 15 STRL SS (BLADE) ×1
CANISTER SUC SOCK COL 7IN (MISCELLANEOUS) IMPLANT
CANISTER SUCT 1200ML W/VALVE (MISCELLANEOUS) IMPLANT
CHLORAPREP W/TINT 26 (MISCELLANEOUS) ×1 IMPLANT
CLIP TI WIDE RED SMALL 6 (CLIP) IMPLANT
COVER BACK TABLE 60X90IN (DRAPES) ×1 IMPLANT
COVER MAYO STAND STRL (DRAPES) ×1 IMPLANT
COVER PROBE CYLINDRICAL 5X96 (MISCELLANEOUS) ×1 IMPLANT
DERMABOND ADVANCED .7 DNX12 (GAUZE/BANDAGES/DRESSINGS) ×1 IMPLANT
DRAPE LAPAROSCOPIC ABDOMINAL (DRAPES) ×1 IMPLANT
DRAPE UTILITY XL STRL (DRAPES) ×1 IMPLANT
DRSG TEGADERM 4X4.75 (GAUZE/BANDAGES/DRESSINGS) IMPLANT
ELECT COATED BLADE 2.86 ST (ELECTRODE) ×1 IMPLANT
ELECT REM PT RETURN 9FT ADLT (ELECTROSURGICAL) ×1
ELECTRODE REM PT RTRN 9FT ADLT (ELECTROSURGICAL) ×1 IMPLANT
GAUZE SPONGE 4X4 12PLY STRL LF (GAUZE/BANDAGES/DRESSINGS) IMPLANT
GLOVE BIO SURGEON STRL SZ7 (GLOVE) ×2 IMPLANT
GLOVE BIOGEL PI IND STRL 7.5 (GLOVE) ×1 IMPLANT
GOWN STRL REUS W/ TWL LRG LVL3 (GOWN DISPOSABLE) ×2 IMPLANT
GOWN STRL REUS W/TWL LRG LVL3 (GOWN DISPOSABLE) ×2
HEMOSTAT ARISTA ABSORB 3G PWDR (HEMOSTASIS) IMPLANT
KIT MARKER MARGIN INK (KITS) ×1 IMPLANT
NDL HYPO 25X1 1.5 SAFETY (NEEDLE) ×1 IMPLANT
NEEDLE HYPO 25X1 1.5 SAFETY (NEEDLE) ×1 IMPLANT
NS IRRIG 1000ML POUR BTL (IV SOLUTION) IMPLANT
PACK BASIN DAY SURGERY FS (CUSTOM PROCEDURE TRAY) ×1 IMPLANT
PENCIL SMOKE EVACUATOR (MISCELLANEOUS) ×1 IMPLANT
RETRACTOR ONETRAX LX 90X20 (MISCELLANEOUS) IMPLANT
SLEEVE SCD COMPRESS KNEE MED (STOCKING) ×1 IMPLANT
SPIKE FLUID TRANSFER (MISCELLANEOUS) IMPLANT
SPONGE T-LAP 4X18 ~~LOC~~+RFID (SPONGE) ×1 IMPLANT
STRIP CLOSURE SKIN 1/2X4 (GAUZE/BANDAGES/DRESSINGS) ×1 IMPLANT
SUT MNCRL AB 4-0 PS2 18 (SUTURE) ×1 IMPLANT
SUT MON AB 5-0 PS2 18 (SUTURE) IMPLANT
SUT SILK 2 0 SH (SUTURE) IMPLANT
SUT VIC AB 2-0 SH 27 (SUTURE) ×1
SUT VIC AB 2-0 SH 27XBRD (SUTURE) ×1 IMPLANT
SUT VIC AB 3-0 SH 27 (SUTURE) ×1
SUT VIC AB 3-0 SH 27X BRD (SUTURE) ×1 IMPLANT
SUT VIC AB 5-0 PS2 18 (SUTURE) IMPLANT
SYR CONTROL 10ML LL (SYRINGE) ×1 IMPLANT
TOWEL GREEN STERILE FF (TOWEL DISPOSABLE) ×1 IMPLANT
TRAY FAXITRON CT DISP (TRAY / TRAY PROCEDURE) ×1 IMPLANT
TUBE CONNECTING 20X1/4 (TUBING) IMPLANT
YANKAUER SUCT BULB TIP NO VENT (SUCTIONS) IMPLANT

## 2022-02-10 NOTE — Transfer of Care (Signed)
Immediate Anesthesia Transfer of Care Note  Patient: Michelle Howell  Procedure(s) Performed: LEFT BREAST LUMPECTOMY WITH RADIOACTIVE SEED LOCALIZATION (Left: Breast)  Patient Location: PACU  Anesthesia Type:General  Level of Consciousness: sedated  Airway & Oxygen Therapy: Patient Spontanous Breathing and Patient connected to face mask oxygen  Post-op Assessment: Report given to RN and Post -op Vital signs reviewed and stable  Post vital signs: Reviewed and stable  Last Vitals:  Vitals Value Taken Time  BP 95/63 02/10/22 1101  Temp    Pulse 69 02/10/22 1102  Resp 9 02/10/22 1102  SpO2 98 % 02/10/22 1102  Vitals shown include unvalidated device data.  Last Pain:  Vitals:   02/10/22 0932  TempSrc: Oral  PainSc: 0-No pain         Complications: No notable events documented.

## 2022-02-10 NOTE — Anesthesia Procedure Notes (Signed)
Procedure Name: LMA Insertion Date/Time: 02/10/2022 10:17 AM  Performed by: Tawni Millers, CRNAPre-anesthesia Checklist: Patient identified, Emergency Drugs available, Suction available and Patient being monitored Patient Re-evaluated:Patient Re-evaluated prior to induction Oxygen Delivery Method: Circle system utilized Preoxygenation: Pre-oxygenation with 100% oxygen Induction Type: IV induction Ventilation: Mask ventilation without difficulty LMA: LMA inserted LMA Size: 4.0 Number of attempts: 1 Airway Equipment and Method: Bite block Placement Confirmation: positive ETCO2 Tube secured with: Tape Dental Injury: Teeth and Oropharynx as per pre-operative assessment

## 2022-02-10 NOTE — Discharge Instructions (Addendum)
NO TYLENOL TILL 3:30  [name] Office Phone Number [phone]  POST OP INSTRUCTIONS Take 400 mg of ibuprofen every 8 hours or 650 mg tylenol every 6 hours for next 72 hours then as needed. Use ice several times daily also.  A prescription for pain medication may be given to you upon discharge.  Take your pain medication as prescribed, if needed.  If narcotic pain medicine is not needed, then you may take acetaminophen (Tylenol), naprosyn (Alleve) or ibuprofen (Advil) as needed. Take your usually prescribed medications unless otherwise directed If you need a refill on your pain medication, please contact your pharmacy.  They will contact our office to request authorization.  Prescriptions will not be filled after 5pm or on week-ends. You should eat very light the first 24 hours after surgery, such as soup, crackers, pudding, etc.  Resume your normal diet the day after surgery. Most patients will experience some swelling and bruising in the breast.  Ice packs and a good support bra will help.  Wear the breast binder provided or a sports bra for 72 hours day and night.  After that wear a sports bra during the day until you return to the office. Swelling and bruising can take several days to resolve.  It is common to experience some constipation if taking pain medication after surgery.  Increasing fluid intake and taking a stool softener will usually help or prevent this problem from occurring.  A mild laxative (Milk of Magnesia or Miralax) should be taken according to package directions if there are no bowel movements after 48 hours. I used skin glue on the incision, you may shower in 24 hours.  The glue will flake off over the next 2-3 weeks.  Any sutures or staples will be removed at the office during your follow-up visit. ACTIVITIES:  You may resume regular daily activities (gradually increasing) beginning the next day.  Wearing a good support bra or sports bra minimizes pain and  swelling.  You may have sexual intercourse when it is comfortable. You may drive when you no longer are taking prescription pain medication, you can comfortably wear a seatbelt, and you can safely maneuver your car and apply brakes. RETURN TO WORK:  ______________________________________________________________________________________ Michelle Howell should see your doctor in the office for a follow-up appointment approximately two weeks after your surgery.  Your doctor's nurse will typically make your follow-up appointment when she calls you with your pathology report.  Expect your pathology report 3-4 business days after your surgery.  You may call to check if you do not hear from [place] after three days. OTHER INSTRUCTIONS: _______________________________________________________________________________________________ _____________________________________________________________________________________________________________________________________ _____________________________________________________________________________________________________________________________________ _____________________________________________________________________________________________________________________________________  WHEN TO CALL DR WAKEFIELD: Fever over 101.0 Nausea and/or vomiting. Extreme swelling or bruising. Continued bleeding from incision. Increased pain, redness, or drainage from the incision.  The clinic staff is available to answer your questions during regular business hours.  Please don't hesitate to call and ask to speak to one of the nurses for clinical concerns.  If you have a medical emergency, go to the nearest emergency room or call 911.  A surgeon from [facility] is always on call at the hospital.  For further questions, please visit [email or web address]   Post Anesthesia Home Care Instructions  Activity: Get plenty of rest for the remainder of the day. A responsible  individual must stay with you for 24 hours following the procedure.  For the next 24 hours, DO NOT: -Drive a car -Paediatric nurse -Drink alcoholic beverages -Take  any medication unless instructed by your physician -Make any legal decisions or sign important papers.  Meals: Start with liquid foods such as gelatin or soup. Progress to regular foods as tolerated. Avoid greasy, spicy, heavy foods. If nausea and/or vomiting occur, drink only clear liquids until the nausea and/or vomiting subsides. Call your physician if vomiting continues.  Special Instructions/Symptoms: Your throat may feel dry or sore from the anesthesia or the breathing tube placed in your throat during surgery. If this causes discomfort, gargle with warm salt water. The discomfort should disappear within 24 hours.  If you had a scopolamine patch placed behind your ear for the management of post- operative nausea and/or vomiting:  1. The medication in the patch is effective for 72 hours, after which it should be removed.  Wrap patch in a tissue and discard in the trash. Wash hands thoroughly with soap and water. 2. You may remove the patch earlier than 72 hours if you experience unpleasant side effects which may include dry mouth, dizziness or visual disturbances. 3. Avoid touching the patch. Wash your hands with soap and water after contact with the patch.

## 2022-02-10 NOTE — Anesthesia Postprocedure Evaluation (Signed)
Anesthesia Post Note  Patient: Michelle Howell  Procedure(s) Performed: LEFT BREAST LUMPECTOMY WITH RADIOACTIVE SEED LOCALIZATION (Left: Breast)     Patient location during evaluation: PACU Anesthesia Type: General Level of consciousness: awake and alert Pain management: pain level controlled Vital Signs Assessment: post-procedure vital signs reviewed and stable Respiratory status: spontaneous breathing, nonlabored ventilation, respiratory function stable and patient connected to nasal cannula oxygen Cardiovascular status: blood pressure returned to baseline and stable Postop Assessment: no apparent nausea or vomiting Anesthetic complications: no  No notable events documented.  Last Vitals:  Vitals:   02/10/22 1130 02/10/22 1148  BP: 121/76 130/84  Pulse: 68 66  Resp: 12 16  Temp:  (!) 36.2 C  SpO2: 95% 97%    Last Pain:  Vitals:   02/10/22 1148  TempSrc: Oral  PainSc:                  Barnet Glasgow

## 2022-02-10 NOTE — H&P (Signed)
67 yof who underwent a screening mm. She had no mass or dc on exam. She has c density breast tissue. On mammogram she has a screening detected left breast distortion. This has [place] correlate with a mass measuring 8x9x7 mm. There was one node that was mildly abnormal and there was second with borderline thickening. Biopsy of the mass was a grade I IDC that is 100% er pos, 90% pr pos, her 2 negative and Ki is <1%. The node biopsy is benign. She works as a Geophysicist/field seismologist. She needs a right shoulder replacement. She is here to discuss options  Review of Systems: A complete review of systems was obtained from the patient. I have reviewed this information and discussed as appropriate with the patient. See HPI as well for other ROS.  Review of Systems All other systems reviewed and are negative.   Medical History: Past Medical History: Diagnosis Date GERD (gastroesophageal reflux disease)  Patient Active Problem List Diagnosis Hyperlipidemia GERD (gastroesophageal reflux disease) Essential hypertension Aortic atherosclerosis (CMS-HCC) Malignant neoplasm of upper-outer quadrant of left breast in female, estrogen receptor positive Arthritis of left ankle  Past Surgical History: Procedure Laterality Date knee surgery N/A 1982 dental implants 1985 shoulder surgery N/A 1991   Allergies Allergen Reactions Meperidine Nausea and Nausea And Vomiting Penicillins Other (See Comments) unknown  Per pt: unknown  Current Outpatient Medications on File Prior to Visit Medication Sig Dispense Refill buPROPion (WELLBUTRIN XL) 300 MG XL tablet Take 300 mg by mouth once daily  No current facility-administered medications on file prior to visit.  Family History Problem Relation Age of Onset Coronary Artery Disease (Blocked arteries around heart) Mother Hyperlipidemia (Elevated cholesterol) Mother Coronary Artery Disease (Blocked arteries around heart) Father Diabetes Sister Coronary Artery  Disease (Blocked arteries around heart) Sister Diabetes Brother Coronary Artery Disease (Blocked arteries around heart) Brother   Social History  Tobacco Use Smoking Status Never Smokeless Tobacco Never Marital status: Single Tobacco Use Smoking status: Never Smokeless tobacco: Never Vaping Use Vaping Use: Never used Substance and Sexual Activity Alcohol use: Yes Comment: 10-14 mLs a week Drug use: Never  Objective: Physical Exam Vitals reviewed. Constitutional: Appearance: Normal appearance. Chest: Breasts: Right: No inverted nipple, mass or nipple discharge. Left: No inverted nipple, mass or nipple discharge. Comments: Left breast hematoma Lymphadenopathy: Upper Body: Right upper body: No supraclavicular or axillary adenopathy. Left upper body: No supraclavicular or axillary adenopathy. Neurological: Mental Status: She is alert.   Assessment and Plan:  Left breast cancer.  Left breast seed guided lumpectomy  We discussed the staging and pathophysiology of breast cancer. We discussed all of the different options for treatment for breast cancer including surgery, chemotherapy, radiation therapy, Herceptin, and antiestrogen therapy.  We discussed a sentinel lymph node biopsy as she does not appear to having lymph node involvement right now. We discussed risks including lymphedema. She has a node biopsy that is negative and otherwise nodes are clinically and radiologically negative. We discussed choosing wisely guidelines which start at age 82. We discussed the SOUND trial data that does apply to her. After this discussion we elected not to proceed with sn biopsy given the biology of the tumor.  We discussed the options for treatment of the breast cancer which included lumpectomy versus a mastectomy. We discussed the performance of the lumpectomy with radioactive seed placement. We discussed a 5-10% chance of a positive margin requiring reexcision in the operating room.  We also discussed that she will likely need radiation therapy if she  undergoes lumpectomy. We discussed mastectomy and the postoperative care for that as well. Mastectomy can be followed by reconstruction. The decision for lumpectomy vs mastectomy has no impact on decision for chemotherapy. Most mastectomy patients will not need radiation therapy. We discussed that there is no difference in her survival whether she undergoes lumpectomy with radiation therapy or antiestrogen therapy versus a mastectomy. There is also no real difference between her recurrence in the breast.  We discussed the risks of operation including bleeding, infection, possible reoperation. She understands her further therapy will be based on what her stages at the time of her operation.

## 2022-02-10 NOTE — Op Note (Signed)
  Preoperative diagnosis: Left breast cancer, clinical stage I Postoperative diagnosis: Same as above Procedure: Left breast radioactive seed guided lumpectomy Surgeon: Dr. Serita Grammes Anesthesia: General Estimated blood loss: Minimal Complications: None Drains: None Specimens: Left breast tissue marked with paint containing seed and clip Additional lateral, medial, superior and posterior margins marked short superior, long lateral double deep Sponge count was correct completion Disposition recovery stable addition   Indications: 62 yof who underwent a screening mm. She had no mass or dc on exam. She has c density breast tissue. On mammogram she has a screening detected left breast distortion. This has [place] correlate with a mass measuring 8x9x7 mm. There was one node that was mildly abnormal and there was second with borderline thickening. Biopsy of the mass was a grade I IDC that is 100% er pos, 90% pr pos, her 2 negative and Ki is <1%. The node biopsy is benign. We discussed options and decided to proceed with lumpectomy alone.    Procedure: After informed consent was obtained the patient was taken to the operating room.  She had a seed placed prior.  I had these mammograms available for my review.  She was placed under general anesthesia without complication.  She was then prepped and draped in standard sterile surgical fashion.  Surgical timeout was then performed.   I noted the seed in the central breast.  I made a curvilinear incision overlying the seed as it was close to the skin and thought was best to remove seed and surrounding tissue. I infiltrated Marcaine throughout this area prior to beginning.  I then used the neoprobe to guide removal of the seed and the surrounding tissue to attempt to get a clear margin. I then did a 3D image and thought I might be close to several margins. . I removed these margins and marked as above. The posterior margin is the muscle now.   Hemostasis was  obtained.  I closed this with 2-0 Vicryl, 3-0 Vicryl, and 4-0 Monocryl.  Glue and Steri-Strips were applied.  She tolerated this well was transferred to recovery stable.

## 2022-02-10 NOTE — Interval H&P Note (Signed)
History and Physical Interval Note:  02/10/2022 9:49 AM  Michelle Howell  has presented today for surgery, with the diagnosis of LEFT BREAST CANCER.  The various methods of treatment have been discussed with the patient and family. After consideration of risks, benefits and other options for treatment, the patient has consented to  Procedure(s): LEFT BREAST LUMPECTOMY WITH RADIOACTIVE SEED LOCALIZATION (Left) as a surgical intervention.  The patient's history has been reviewed, patient examined, no change in status, stable for surgery.  I have reviewed the patient's chart and labs.  Questions were answered to the patient's satisfaction.     Rolm Bookbinder

## 2022-02-11 ENCOUNTER — Encounter (HOSPITAL_BASED_OUTPATIENT_CLINIC_OR_DEPARTMENT_OTHER): Payer: Self-pay | Admitting: General Surgery

## 2022-02-11 NOTE — Progress Notes (Signed)
Left message stating courtesy call and if any questions or concerns please call the doctors office.  

## 2022-02-12 DIAGNOSIS — C50912 Malignant neoplasm of unspecified site of left female breast: Secondary | ICD-10-CM | POA: Diagnosis not present

## 2022-02-12 LAB — SURGICAL PATHOLOGY

## 2022-02-14 ENCOUNTER — Encounter: Payer: Self-pay | Admitting: *Deleted

## 2022-02-20 ENCOUNTER — Encounter: Payer: Self-pay | Admitting: *Deleted

## 2022-02-20 ENCOUNTER — Telehealth: Payer: Self-pay | Admitting: *Deleted

## 2022-02-20 NOTE — Telephone Encounter (Signed)
Ordered oncotype per Dr. Gudena.  Requisition sent to pathology and exact sciences. 

## 2022-02-21 NOTE — Progress Notes (Signed)
Patient Care Team: Marin Olp, MD as PCP - General (Family Medicine) Mauro Kaufmann, RN as Oncology Nurse Navigator Rockwell Germany, RN as Oncology Nurse Navigator Nicholas Lose, MD as Consulting Physician (Hematology and Oncology) Rolm Bookbinder, MD as Consulting Physician (General Surgery) Gery Pray, MD as Consulting Physician (Radiation Oncology)  DIAGNOSIS: No diagnosis found.  SUMMARY OF ONCOLOGIC HISTORY: Oncology History  Malignant neoplasm of upper-outer quadrant of left breast in female, estrogen receptor positive ([place])  01/10/2022 Initial Diagnosis   Screening mammogram detected left breast distortion and mass polyp-like position measuring 0.9 cm, 1 abnormal lymph node biopsy: Benign concordant, breast biopsy: Grade 1 IDC ER 100%, PR 90%, Ki-67 < 1%, HER2 2+ by IHC FISH negative   01/22/2022 Cancer Staging   Staging form: Breast, AJCC 8th Edition - Clinical: Stage IA (cT1b, cN0, cM0, G1, ER+, PR+, HER2-) - Signed by Nicholas Lose, MD on 01/22/2022 Stage prefix: Initial diagnosis Histologic grading system: 3 grade system   02/03/2022 Genetic Testing   Negative [facility] Multi-Cancer +RNA Panel.  Report date is 02/03/2022.   The Multi-Cancer + RNA Panel offered by [facility] includes sequencing and/or deletion/duplication analysis of the following 70 genes:  AIP*, ALK, APC*, ATM*, AXIN2*, BAP1*, BARD1*, BLM*, BMPR1A*, BRCA1*, BRCA2*, BRIP1*, CDC73*, CDH1*, CDK4, CDKN1B*, CDKN2A, CHEK2*, CTNNA1*, DICER1*, EPCAM (del/dup only), EGFR, FH*, FLCN*, GREM1 (promoter dup only), HOXB13, KIT, LZTR1, MAX*, MBD4, MEN1*, MET, MITF, MLH1*, MSH2*, MSH3*, MSH6*, MUTYH*, NF1*, NF2*, NTHL1*, PALB2*, PDGFRA, PMS2*, POLD1*, POLE*, POT1*, PRKAR1A*, PTCH1*, PTEN*, RAD51C*, RAD51D*, RB1*, RET, SDHA* (sequencing only), SDHAF2*, SDHB*, SDHC*, SDHD*, SMAD4*, SMARCA4*, SMARCB1*, SMARCE1*, STK11*, SUFU*, TMEM127*, TP53*, TSC1*, TSC2*, VHL*. RNA analysis is performed for * genes.     CHIEF  COMPLIANT:   INTERVAL HISTORY: LONEY BALRAM is a   ALLERGIES:  is allergic to meperidine hcl and penicillins.  MEDICATIONS:  Current Outpatient Medications  Medication Sig Dispense Refill   amLODipine (NORVASC) 5 MG tablet Take 1 tablet (5 mg total) by mouth daily. 90 tablet 3   Ascorbic Acid (VITAMIN C) 1000 MG tablet Take 1,000 mg by mouth daily.     buPROPion (WELLBUTRIN XL) 300 MG 24 hr tablet TAKE 1 TABLET BY MOUTH DAILY 90 tablet 0   Cholecalciferol (VITAMIN D) 2000 UNITS tablet Take 2,000 Units by mouth daily.     Coenzyme Q10 (CO Q 10 PO) Take by mouth daily.     Omega 3-6-9 Fatty Acids (OMEGA-3-6-9 PO) Take by mouth daily.     omeprazole (PRILOSEC) 20 MG capsule Take 1 capsule (20 mg total) by mouth daily. 90 capsule 1   PINE BARK, PYCNOGENOL, PO Take by mouth daily.     potassium chloride (MICRO-K) 10 MEQ CR capsule Take 10 mEq by mouth 2 (two) times daily.     Red Yeast Rice Extract (RED YEAST RICE PO) Take by mouth daily.     rosuvastatin (CRESTOR) 10 MG tablet Take 1 tablet (10 mg total) by mouth daily. 90 tablet 3   No current facility-administered medications for this visit.    PHYSICAL EXAMINATION: ECOG PERFORMANCE STATUS: {CHL ONC ECOG PS:[phone]}  There were no vitals filed for this visit. There were no vitals filed for this visit.  BREAST:*** No palpable masses or nodules in either right or left breasts. No palpable axillary supraclavicular or infraclavicular adenopathy no breast tenderness or nipple discharge. (exam performed in the presence of a chaperone)  LABORATORY DATA:  I have reviewed the data as listed    Latest  Ref Rng & Units 01/22/2022    8:30 AM 11/22/2021    3:50 PM 05/24/2020    4:22 PM  CMP  Glucose 70 - 99 mg/dL 95  80  80   BUN 8 - 23 mg/dL 15  16  14   $ Creatinine 0.44 - 1.00 mg/dL 0.77  0.64  0.73   Sodium 135 - 145 mmol/L 138  138  138   Potassium 3.5 - 5.1 mmol/L 4.1  3.9  4.5   Chloride 98 - 111 mmol/L 104  102  98   CO2 22  - 32 mmol/L 29  25  23   $ Calcium 8.9 - 10.3 mg/dL 10.0  10.1  9.8   Total Protein 6.5 - 8.1 g/dL 6.6  7.1  7.3   Total Bilirubin 0.3 - 1.2 mg/dL 0.8  0.5  0.4   Alkaline Phos 38 - 126 U/L 63   96   AST 15 - 41 U/L 22  28  24   $ ALT 0 - 44 U/L 32  32  25     Lab Results  Component Value Date   WBC 4.2 01/22/2022   HGB 13.6 01/22/2022   HCT 39.1 01/22/2022   MCV 91.1 01/22/2022   PLT 267 01/22/2022   NEUTROABS 2.4 01/22/2022    ASSESSMENT & PLAN:  No problem-specific Assessment & Plan notes found for this encounter.    No orders of the defined types were placed in this encounter.  The patient has a good understanding of the overall plan. she agrees with it. she will call with any problems that may develop before the next visit here. Total time spent: 30 mins including face to face time and time spent for planning, charting and co-ordination of care   Suzzette Righter, [place] 02/21/22    I Gardiner Coins am acting as a Education administrator for [employer]  ***

## 2022-02-24 ENCOUNTER — Inpatient Hospital Stay: Payer: PPO | Attending: Hematology and Oncology | Admitting: Hematology and Oncology

## 2022-02-24 VITALS — BP 138/80 | HR 85 | Temp 97.7°F | Resp 16 | Ht 66.0 in | Wt 177.8 lb

## 2022-02-24 DIAGNOSIS — C50412 Malignant neoplasm of upper-outer quadrant of left female breast: Secondary | ICD-10-CM | POA: Diagnosis not present

## 2022-02-24 DIAGNOSIS — Z170 Estrogen receptor positive status [ER+]: Secondary | ICD-10-CM

## 2022-02-24 NOTE — Assessment & Plan Note (Signed)
02/10/22: Left lumpectomy: Grade 1 IDC 1 cm with low-grade DCIS, margins negative, ER 100%, PR 90%, HER2 negative, Ki-67 less than 1%, no lymph nodes evaluated.  Pathology counseling: I discussed the final pathology report of the patient provided  a copy of this report. I discussed the margins as well as lymph node surgeries. We also discussed the final staging along with previously performed ER/PR and HER-2/neu testing.  Treatment plan: Oncotype DX testing to determine if chemotherapy would be of any benefit followed by 2. Adjuvant radiation therapy followed by 3. Adjuvant antiestrogen therapy  RTC based upon Oncotype DX test result

## 2022-03-06 ENCOUNTER — Telehealth: Payer: Self-pay | Admitting: Family Medicine

## 2022-03-06 ENCOUNTER — Ambulatory Visit: Payer: PPO | Admitting: Family Medicine

## 2022-03-06 ENCOUNTER — Ambulatory Visit (INDEPENDENT_AMBULATORY_CARE_PROVIDER_SITE_OTHER): Payer: PPO | Admitting: Family Medicine

## 2022-03-06 ENCOUNTER — Ambulatory Visit: Payer: Self-pay

## 2022-03-06 ENCOUNTER — Ambulatory Visit (INDEPENDENT_AMBULATORY_CARE_PROVIDER_SITE_OTHER): Payer: PPO

## 2022-03-06 ENCOUNTER — Encounter: Payer: Self-pay | Admitting: Family Medicine

## 2022-03-06 VITALS — BP 138/92 | HR 86 | Ht 66.0 in | Wt 179.0 lb

## 2022-03-06 VITALS — BP 138/92 | HR 86 | Temp 97.3°F | Ht 66.0 in | Wt 179.0 lb

## 2022-03-06 DIAGNOSIS — S63642A Sprain of metacarpophalangeal joint of left thumb, initial encounter: Secondary | ICD-10-CM | POA: Diagnosis not present

## 2022-03-06 DIAGNOSIS — M79645 Pain in left finger(s): Secondary | ICD-10-CM | POA: Diagnosis not present

## 2022-03-06 DIAGNOSIS — I10 Essential (primary) hypertension: Secondary | ICD-10-CM | POA: Diagnosis not present

## 2022-03-06 DIAGNOSIS — M1812 Unilateral primary osteoarthritis of first carpometacarpal joint, left hand: Secondary | ICD-10-CM | POA: Diagnosis not present

## 2022-03-06 NOTE — Patient Instructions (Signed)
It was very nice to see you today!  You probably have a fracture or a severe sprain in your left thumb.  Please continue using ice and anti-inflammatories.  Please use a thumb spica splint to help stabilize the area and promote recovery.  We will urgently refer you to see sports medicine for further evaluation.  They can check x-rays over there.  Take care, Dr Jerline Pain  PLEASE NOTE:  If you had any lab tests, please let us know if you have not heard back within a few days. You may see your results on mychart before we have a chance to review them but we will give you a call once they are reviewed by [place].   If we ordered any referrals today, please let us know if you have not heard from their office within the next week.   If you had any urgent prescriptions sent in today, please check with the pharmacy within an hour of our visit to make sure the prescription was transmitted appropriately.   Please try these tips to maintain a healthy lifestyle:  Eat at least 3 REAL meals and 1-2 snacks per day.  Aim for no more than 5 hours between eating.  If you eat breakfast, please do so within one hour of getting up.   Each meal should contain half fruits/vegetables, one quarter protein, and one quarter carbs (no bigger than a computer mouse)  Cut down on sweet beverages. This includes juice, soda, and sweet tea.   Drink at least 1 glass of water with each meal and aim for at least 8 glasses per day  Exercise at least 150 minutes every week.

## 2022-03-06 NOTE — Progress Notes (Signed)
   Michelle Howell is a 67 y.o. female who presents today for an office visit.  Assessment/Plan:  New/Acute Problems: Left Thumb Pain Concern for fracture versus sprain.  She is about a week out from injury at this point.  She is neurovascular intact distally without any signs of vascular compromise or compartment syndrome.  We did discuss conservative management including ice and NSAIDs.  Also discussed splinting with thumb spica splint.  Given her degree of pain and significant impact this is having on her ability to work would be reasonable for her to follow-up with specialist at this point.  Will place referral to sports medicine.  We did discuss checking an x-ray today however discussed that she will need to follow-up with specialist regardless and she would like to defer getting an x-ray until she follows up with them.   Chronic Problems Addressed Today: Essential Hypertension  Initially elevated to 156/99.  Improved on recheck to 138/92.  Likely elevated in setting of acute pain.  She has been following with her PCP as well for this.  Will continue amlodipine 5 mg daily.  She will continue to monitor at home and let us know if persistently elevated.     Subjective:  HPI:  Patient here with thumb pain. She fell 8 days ago while working in the yard. She caught her toe on a piece of slate and fell on to her right side. She had a lot of bruising on her right hip and leg. Some bruising on her mid chest as well. Her main concern is significant pain and swelling on her left thumb. This swelling has improved but is still present. She had a lot of bruising which has also resolved.  She does have significant difficulty with using her left arm which has made it difficult with performing ADLs.  She also works as a Geophysicist/field seismologist and her injury has made it nearly impossible for her to work.       Objective:  Physical Exam: BP (!) 138/92   Pulse 86   Temp (!) 97.3 F (36.3 C) (Temporal)    Ht 5' 6"$  (1.676 m)   Wt 179 lb (81.2 kg)   LMP  (LMP Unknown)   SpO2 97%   BMI 28.89 kg/m   Gen: No acute distress, resting comfortably MSK: - Left Hand: Edema and ecchymosis noted along first MCP joint.  Very tender to palpation.  Neurovascular intact distally. Neuro: Grossly normal, moves all extremities Psych: Normal affect and thought content      Tyre Beaver M. Jerline Pain, MD 03/06/2022 9:39 AM

## 2022-03-06 NOTE — Patient Instructions (Addendum)
Thank you for coming in today.   TeePee thumb brace or Thumb Keeper or Short Thumb Spica Splint.   OT could make a custom splint.   Duanne Limerick is probably not enough for this.   Please get an Xray today before you leave   I think you have a GameKeeper thumb but not a Stenner Lesion.   Recheck in 1 month.

## 2022-03-06 NOTE — Telephone Encounter (Signed)
Patient called stating that she discussed with Dr Georgina Snell that she would most likley not be able to work for the next month with her current thumb injury. She said that her job will need a letter from Dr Georgina Snell as a leave of absence so that they can start that process.  Is this okay?  Please call patient when letter is ready and she will pick it up.

## 2022-03-06 NOTE — Progress Notes (Signed)
   I, Peterson Lombard, LAT, ATC acting as a scribe for [name], MD.  Subjective:    CC: L thumb pain  HPI: Pt is a 67 y/o female c/o L thumb pain x 1 week. MOI: fell in the yard, [place]. Of note, pt underwent a L breast lumpectomy on 02/10/22. Pt locates pain to base of the thumb. Swelling present. TTP, some bruising still present. Pain worse with any type of movement. Has been out of work x 3.5 weeks s/p lumpectomy. No recent imaging of the thumb/hand/wrist.   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  She is right-hand dominant.  L thumb swelling: yes Grip strength: decreased Aggravates: movement, use Treatments tried: ice, Advil Dual Action, Diclofenac  Pertinent review of Systems: No fevers or chills  Relevant historical information: Right shoulder DJD Breast cancer status postlumpectomy.  Objective:    Vitals:   03/06/22 1336  BP: (!) 138/92  Pulse: 86  SpO2: 97%   General: Well Developed, well nourished, and in no acute distress.   MSK: Left hand and thumb: Swelling and bruising present at first MCP.  Tender palpation ulnar aspect of MCP. Normal thumb motion. Some laxity present with UCL stress test. Intact motion and strength to thumb flexion especially IP joint. Pulse cap refill and sensation are intact distally.  Lab and Radiology Results   Diagnostic Limited MSK Ultrasound of: Left thumb MCP No obvious fracture or cortical defect visible at the dorsal and volar or radial aspect of the thumb.  Ulnar aspect Visualized. Probable disruption of UCL visible with some gapping with UCL stress visible. No Stener lesion is visible Impression: Probable UCL tear without Stener lesion.  X-ray images left hand obtained today personally and independently interpreted. DJD first Harts.  No acute fracture is visible. Await formal radiology review   Impression and Recommendations:    Assessment and Plan: 67 y.o. female with left thumb  injury occurring about a week ago after a fall.  Concern for UCL injury.  I do not see a Stener lesion on ultrasound.  Plan for immobilization with a thumb keeper splint.  We also talked about the possibility of a custom splint with hand therapy.  Recheck in 1 month.Marland Kitchen  PDMP not reviewed this encounter. Orders Placed This Encounter  Procedures   [place] LIMITED JOINT SPACE STRUCTURES UP LEFT(NO LINKED CHARGES)    Order Specific Question:   Reason for Exam (SYMPTOM  OR DIAGNOSIS REQUIRED)    Answer:   left thumb pain s/p fall 1 week ago    Order Specific Question:   Preferred imaging location?    Answer:   [place]   DG Hand Complete Left    Standing Status:   Future    Number of Occurrences:   1    Standing Expiration Date:   04/04/2022    Order Specific Question:   Reason for Exam (SYMPTOM  OR DIAGNOSIS REQUIRED)    Answer:   left thumb pain s/p fall 1 week ago    Order Specific Question:   Preferred imaging location?    Answer:   [facility]   No orders of the defined types were placed in this encounter.   Discussed warning signs or symptoms. Please see discharge instructions. Patient expresses understanding.   The above documentation has been reviewed and is accurate and complete [name], M.D.

## 2022-03-07 ENCOUNTER — Encounter: Payer: Self-pay | Admitting: Family Medicine

## 2022-03-07 NOTE — Telephone Encounter (Signed)
Work note written through April 8.  This was sent through [place].  We can print a copy if you need a printed copy.

## 2022-03-07 NOTE — Telephone Encounter (Signed)
Spoke to patient. She would like to pick up in office. Letter printed.

## 2022-03-10 DIAGNOSIS — L814 Other melanin hyperpigmentation: Secondary | ICD-10-CM | POA: Diagnosis not present

## 2022-03-10 DIAGNOSIS — L821 Other seborrheic keratosis: Secondary | ICD-10-CM | POA: Diagnosis not present

## 2022-03-10 NOTE — Progress Notes (Signed)
Left hand x-ray shows arthritis at the base of the thumb.

## 2022-03-17 ENCOUNTER — Ambulatory Visit (INDEPENDENT_AMBULATORY_CARE_PROVIDER_SITE_OTHER): Payer: PPO

## 2022-03-17 VITALS — Wt 179.0 lb

## 2022-03-17 DIAGNOSIS — Z0000 Encounter for general adult medical examination without abnormal findings: Secondary | ICD-10-CM | POA: Diagnosis not present

## 2022-03-17 DIAGNOSIS — C50412 Malignant neoplasm of upper-outer quadrant of left female breast: Secondary | ICD-10-CM | POA: Diagnosis not present

## 2022-03-17 DIAGNOSIS — Z170 Estrogen receptor positive status [ER+]: Secondary | ICD-10-CM | POA: Diagnosis not present

## 2022-03-17 NOTE — Patient Instructions (Signed)
Michelle Howell , Thank you for taking time to come for your Medicare Wellness Visit. I appreciate your ongoing commitment to your health goals. Please review the following plan we discussed and let me know if I can assist you in the future.   These are the goals we discussed:  Goals   None     This is a list of the screening recommended for you and due dates:  Health Maintenance  Topic Date Due   COVID-19 Vaccine (5 - 2023-24 season) 02/07/2022   Flu Shot  04/07/2022*   Zoster (Shingles) Vaccine (1 of 2) 06/04/2022*   Pneumonia Vaccine (1 of 1 - PCV) 11/23/2022*   DEXA scan (bone density measurement)  11/23/2022*   Mammogram  03/24/2022   Medicare Annual Wellness Visit  03/17/2023   Colon Cancer Screening  12/20/2024   DTaP/Tdap/Td vaccine (3 - Td or Tdap) 10/09/2027   HPV Vaccine  Aged Out   Hepatitis C Screening: USPSTF Recommendation to screen - Ages 40-79 yo.  Discontinued  *Topic was postponed. The date shown is not the original due date.    Advanced directives: Advance directive discussed with you today. Even though you declined this today please call our office should you change your mind and we can give you the proper paperwork for you to fill out.  Conditions/risks identified: build up my strength   Next appointment: Follow up in one year for your annual wellness visit    Preventive Care 65 Years and Older, Female Preventive care refers to lifestyle choices and visits with your health care provider that can promote health and wellness. What does preventive care include? A yearly physical exam. This is also called an annual well check. Dental exams once or twice a year. Routine eye exams. Ask your health care provider how often you should have your eyes checked. Personal lifestyle choices, including: Daily care of your teeth and gums. Regular physical activity. Eating a healthy diet. Avoiding tobacco and drug use. Limiting alcohol use. Practicing safe sex. Taking  low-dose aspirin every day. Taking vitamin and mineral supplements as recommended by your health care provider. What happens during an annual well check? The services and screenings done by your health care provider during your annual well check will depend on your age, overall health, lifestyle risk factors, and family history of disease. Counseling  Your health care provider may ask you questions about your: Alcohol use. Tobacco use. Drug use. Emotional well-being. Home and relationship well-being. Sexual activity. Eating habits. History of falls. Memory and ability to understand (cognition). Work and work Statistician. Reproductive health. Screening  You may have the following tests or measurements: Height, weight, and BMI. Blood pressure. Lipid and cholesterol levels. These may be checked every 5 years, or more frequently if you are over 3 years old. Skin check. Lung cancer screening. You may have this screening every year starting at age 39 if you have a 30-pack-year history of smoking and currently smoke or have quit within the past 15 years. Fecal occult blood test (FOBT) of the stool. You may have this test every year starting at age 53. Flexible sigmoidoscopy or colonoscopy. You may have a sigmoidoscopy every 5 years or a colonoscopy every 10 years starting at age 8. Hepatitis C blood test. Hepatitis B blood test. Sexually transmitted disease (STD) testing. Diabetes screening. This is done by checking your blood sugar (glucose) after you have not eaten for a while (fasting). You may have this done every 1-3 years. Bone density  scan. This is done to screen for osteoporosis. You may have this done starting at age 75. Mammogram. This may be done every 1-2 years. Talk to your health care provider about how often you should have regular mammograms. Talk with your health care provider about your test results, treatment options, and if necessary, the need for more tests. Vaccines   Your health care provider may recommend certain vaccines, such as: Influenza vaccine. This is recommended every year. Tetanus, diphtheria, and acellular pertussis (Tdap, Td) vaccine. You may need a Td booster every 10 years. Zoster vaccine. You may need this after age 76. Pneumococcal 13-valent conjugate (PCV13) vaccine. One dose is recommended after age 50. Pneumococcal polysaccharide (PPSV23) vaccine. One dose is recommended after age 17. Talk to your health care provider about which screenings and vaccines you need and how often you need them. This information is not intended to replace advice given to you by your health care provider. Make sure you discuss any questions you have with your health care provider. Document Released: 01/19/2015 Document Revised: 09/12/2015 Document Reviewed: 10/24/2014 Elsevier Interactive Patient Education  2017 [place] Prevention in the Home Falls can cause injuries. They can happen to people of all ages. There are many things you can do to make your home safe and to help prevent falls. What can I do on the outside of my home? Regularly fix the edges of walkways and driveways and fix any cracks. Remove anything that might make you trip as you walk through a door, such as a raised step or threshold. Trim any bushes or trees on the path to your home. Use bright outdoor lighting. Clear any walking paths of anything that might make someone trip, such as rocks or tools. Regularly check to see if handrails are loose or broken. Make sure that both sides of any steps have handrails. Any raised decks and porches should have guardrails on the edges. Have any leaves, snow, or ice cleared regularly. Use sand or salt on walking paths during winter. Clean up any spills in your garage right away. This includes oil or grease spills. What can I do in the bathroom? Use night lights. Install grab bars by the toilet and in the tub and shower. Do not use towel  bars as grab bars. Use non-skid mats or decals in the tub or shower. If you need to sit down in the shower, use a plastic, non-slip stool. Keep the floor dry. Clean up any water that spills on the floor as soon as it happens. Remove soap buildup in the tub or shower regularly. Attach bath mats securely with double-sided non-slip rug tape. Do not have throw rugs and other things on the floor that can make you trip. What can I do in the bedroom? Use night lights. Make sure that you have a light by your bed that is easy to reach. Do not use any sheets or blankets that are too big for your bed. They should not hang down onto the floor. Have a firm chair that has side arms. You can use this for support while you get dressed. Do not have throw rugs and other things on the floor that can make you trip. What can I do in the kitchen? Clean up any spills right away. Avoid walking on wet floors. Keep items that you use a lot in easy-to-reach places. If you need to reach something above you, use a strong step stool that has a grab bar. Keep electrical  cords out of the way. Do not use floor polish or wax that makes floors slippery. If you must use wax, use non-skid floor wax. Do not have throw rugs and other things on the floor that can make you trip. What can I do with my stairs? Do not leave any items on the stairs. Make sure that there are handrails on both sides of the stairs and use them. Fix handrails that are broken or loose. Make sure that handrails are as long as the stairways. Check any carpeting to make sure that it is firmly attached to the stairs. Fix any carpet that is loose or worn. Avoid having throw rugs at the top or bottom of the stairs. If you do have throw rugs, attach them to the floor with carpet tape. Make sure that you have a light switch at the top of the stairs and the bottom of the stairs. If you do not have them, ask someone to add them for you. What else can I do to help  prevent falls? Wear shoes that: Do not have high heels. Have rubber bottoms. Are comfortable and fit you well. Are closed at the toe. Do not wear sandals. If you use a stepladder: Make sure that it is fully opened. Do not climb a closed stepladder. Make sure that both sides of the stepladder are locked into place. Ask someone to hold it for you, if possible. Clearly mark and make sure that you can see: Any grab bars or handrails. First and last steps. Where the edge of each step is. Use tools that help you move around (mobility aids) if they are needed. These include: Canes. Walkers. Scooters. Crutches. Turn on the lights when you go into a dark area. Replace any light bulbs as soon as they burn out. Set up your furniture so you have a clear path. Avoid moving your furniture around. If any of your floors are uneven, fix them. If there are any pets around you, be aware of where they are. Review your medicines with your doctor. Some medicines can make you feel dizzy. This can increase your chance of falling. Ask your doctor what other things that you can do to help prevent falls. This information is not intended to replace advice given to you by your health care provider. Make sure you discuss any questions you have with your health care provider. Document Released: 10/19/2008 Document Revised: 05/31/2015 Document Reviewed: 01/27/2014 Elsevier Interactive Patient Education  2017 [employer].

## 2022-03-17 NOTE — Progress Notes (Addendum)
I connected with  Michelle Howell on 03/17/22 by a audio enabled telemedicine application and verified that I am speaking with the correct person using two identifiers.  Patient Location: Home  Provider Location: Office/Clinic  I discussed the limitations of evaluation and management by telemedicine. The patient expressed understanding and agreed to proceed.   Subjective:   Michelle Howell is a 67 y.o. female who presents for an Initial Medicare Annual Wellness Visit.  Review of Systems     Cardiac Risk Factors include: advanced age (>29mn, >>34women);hypertension;dyslipidemia     Objective:    Today's Vitals   03/17/22 1129  Weight: 179 lb (81.2 kg)   Body mass index is 28.89 kg/m.     03/17/2022   11:37 AM 02/24/2022    2:16 PM 02/10/2022    9:29 AM 01/31/2022    4:03 PM  Advanced Directives  Does Patient Have a Medical Advance Directive? Yes Yes Yes Yes  Type of [place]amed[place]of [place] will Healthcare Power of [place] Does patient want to make changes to medical advance directive?  No - Patient declined No - Patient declined   Copy of [place] Chart? No - copy requested No - copy requested No - copy requested     Current Medications (verified) Outpatient Encounter Medications as of 03/17/2022  Medication Sig   amLODipine (NORVASC) 5 MG tablet Take 1 tablet (5 mg total) by mouth daily.   Ascorbic Acid (VITAMIN C) 1000 MG tablet Take 1,000 mg by mouth daily.   buPROPion (WELLBUTRIN XL) 300 MG 24 hr tablet TAKE 1 TABLET BY MOUTH DAILY   Cholecalciferol (VITAMIN D) 2000 UNITS tablet Take 2,000 Units by mouth daily.   Coenzyme Q10 (CO Q 10 PO) Take by mouth daily.   MAGNESIUM PO Take by mouth.   Misc Natural Products (YUMVS BEET ROOT-TART CHERRY PO) Take by mouth.   Omega 3-6-9 Fatty Acids (OMEGA-3-6-9 PO) Take by mouth daily.   PINE BARK, PYCNOGENOL, PO Take  by mouth daily.   potassium chloride (MICRO-K) 10 MEQ CR capsule Take 10 mEq by mouth 2 (two) times daily.   Red Yeast Rice Extract (RED YEAST RICE PO) Take by mouth daily.   rosuvastatin (CRESTOR) 10 MG tablet Take 1 tablet (10 mg total) by mouth daily.   No facility-administered encounter medications on file as of 03/17/2022.    Allergies (verified) Meperidine hcl and Penicillins   History: Past Medical History:  Diagnosis Date   GERD (gastroesophageal reflux disease)    Hypertension    Past Surgical History:  Procedure Laterality Date   BREAST BIOPSY Left 01/10/2022   [place] BREAST BX W LOC DEV 1ST LESION IMG BX SPEC [place] 01/10/2022 [facility]   BREAST BIOPSY  02/06/2022   MM LT RADIOACTIVE SEED LOC MAMMO GUIDE 02/06/2022 [facility]   BREAST LUMPECTOMY WITH RADIOACTIVE SEED LOCALIZATION Left 02/10/2022   Procedure: LEFT BREAST LUMPECTOMY WITH RADIOACTIVE SEED LOCALIZATION;  Surgeon: WRolm Bookbinder MD;  [place]  Service: General;  Laterality: Left;   dental implants  [place]  left   [place]   Family History  Problem Relation Age of Onset   Hypertension Mother    Hyperlipidemia Mother    Heart disease Mother    Heart attack Mother    Heart disease Father  Hypertension Father    Heart failure Father    Heart attack Father    Diabetes Sister    CAD Sister        minor heart attack in her 77s   Kidney disease Sister        required transplant after severe c diff   Diabetes Brother    Kidney cancer Brother        dx early 1s   Heart disease Brother        died at 29    Other Brother        back issues   CAD Brother        age 72   Brain cancer Paternal Aunt        d. after 6   Kidney cancer Cousin        paternal female cousin; dx after 21   Colon cancer Neg Hx    Colon polyps Neg Hx    Esophageal cancer Neg Hx    Rectal cancer Neg Hx    Stomach cancer Neg Hx    Social  History   Socioeconomic History   Marital status: Married    Spouse name: Not on file   Number of children: Not on file   Years of education: Not on file   Highest education level: Not on file  Occupational History   Not on file  Tobacco Use   Smoking status: Never   Smokeless tobacco: Never  Vaping Use   Vaping Use: Never used  Substance and Sexual Activity   Alcohol use: Yes    Alcohol/week: 14.0 standard drinks of alcohol    Types: 14 Cans of beer per week   Drug use: No   Sexual activity: Not on file  Other Topics Concern   Not on file  Social History Narrative   Married      Geophysicist/field seismologist      Hobbies: tennis   Social Determinants of Health   Financial Resource Strain: Low Risk  (03/17/2022)   Overall Financial Resource Strain (CARDIA)    Difficulty of Paying Living Expenses: Not hard at all  Recent Concern: Financial Resource Strain - Medium Risk (01/22/2022)   Overall Financial Resource Strain (CARDIA)    Difficulty of Paying Living Expenses: Somewhat hard  Food Insecurity: No Food Insecurity (03/17/2022)   Hunger Vital Sign    Worried About Running Out of Food in the Last Year: Never true    [place] in the Last Year: Never true  Transportation Needs: No Transportation Needs (03/17/2022)   PRAPARE - Hydrologist (Medical): No    Lack of Transportation (Non-Medical): No  Physical Activity: Sufficiently Active (03/17/2022)   Exercise Vital Sign    Days of Exercise per Week: 3 days    Minutes of Exercise per Session: 120 min  Stress: Stress Concern Present (03/17/2022)   [place]    Feeling of Stress : Rather much  Social Connections: Moderately Integrated (03/17/2022)   Social Connection and Isolation Panel [NHANES]    Frequency of Communication with Friends and Family: More than three times a week    Frequency of Social Gatherings with Friends and Family:  More than three times a week    Attends Religious Services: Never    Marine scientist or Organizations: Yes    Attends Archivist Meetings: 1 to 4 times per year  Marital Status: Married    Tobacco Counseling Counseling given: Not Answered   Clinical Intake:  Pre-visit preparation completed: Yes        BMI - recorded: 28.89 Nutritional Status: BMI 25 -29 Overweight Nutritional Risks: None Diabetes: No  How often do you need to have someone help you when you read instructions, pamphlets, or other written materials from your doctor or pharmacy?: 1 - Never  Diabetic?no  Interpreter Needed?: No  Information entered by :: Charlott Rakes, LPN   Activities of Daily Living    03/17/2022   11:39 AM 02/10/2022    9:32 AM  In your present state of health, do you have any difficulty performing the following activities:  Hearing? 0 0  Vision? 0 0  Difficulty concentrating or making decisions? 0 0  Walking or climbing stairs? 0 0  Dressing or bathing? 1 0  Comment at this time   Doing errands, shopping? 0   Preparing Food and eating ? Y   Comment at this time   Using the Toilet? N   In the past six months, have you accidently leaked urine? N   Do you have problems with loss of bowel control? N   Managing your Medications? N   Managing your Finances? N   Housekeeping or managing your Housekeeping? N     Patient Care Team: Marin Olp, MD as PCP - General (Family Medicine) Mauro Kaufmann, RN as Oncology Nurse Navigator Rockwell Germany, RN as Oncology Nurse Navigator Nicholas Lose, MD as Consulting Physician (Hematology and Oncology) Rolm Bookbinder, MD as Consulting Physician (General Surgery) Gery Pray, MD as Consulting Physician (Radiation Oncology)  Indicate any recent [place] you may have received from other than [facility] providers in the past year (date may be approximate).     Assessment:   This is a routine wellness  examination for [facility].  Hearing/Vision screen Hearing Screening - Comments:: Pt denies any hearing issues  Vision Screening - Comments:: Pt follows up with provider downtown for annual eye exams   Dietary issues and exercise activities discussed: Current Exercise Habits: Home exercise routine, Type of exercise: Other - see comments, Time (Minutes): > 60, Frequency (Times/Week): 3, Weekly Exercise (Minutes/Week): 0   Goals Addressed             This Visit's Progress    Patient Stated       Build some strength        Depression Screen    03/17/2022   11:34 AM 03/06/2022    9:07 AM 05/30/2021    3:17 PM 05/24/2020    4:02 PM 04/29/2019    3:54 PM 10/08/2017   11:22 AM  PHQ 2/9 Scores  PHQ - 2 Score 0 0 0 0 0 0  PHQ- 9 Score   0 0 0     Fall Risk    03/17/2022   11:38 AM 03/06/2022    9:07 AM 11/22/2021    2:37 PM 05/30/2021    3:17 PM 05/24/2020    4:01 PM  Fall Risk   Falls in the past year? 1 1 0 0 0  Number falls in past yr: 1 1 0 0 0  Injury with Fall? 1 1 0 0 0  Comment left thumb ligament tear      Risk for fall due to : Impaired balance/gait;Impaired vision;History of fall(s) No Fall Risks No Fall Risks No Fall Risks   Follow up Falls prevention discussed  Falls evaluation completed  Falls evaluation completed     FALL RISK PREVENTION PERTAINING TO THE HOME:  Any stairs in or around the home? Yes  If so, are there any without handrails? No  Home free of loose throw rugs in walkways, pet beds, electrical cords, etc? Yes  Adequate lighting in your home to reduce risk of falls? Yes   ASSISTIVE DEVICES UTILIZED TO PREVENT FALLS:  Life alert? No  Use of a cane, walker or w/c? No  Grab bars in the bathroom? No  Shower chair or bench in shower? No  Elevated toilet seat or a handicapped toilet? No   TIMED UP AND GO:  Was the test performed? No .   Cognitive Function:Pt declined         Immunizations Immunization History  Administered Date(s)  Administered   PFIZER(Purple Top)SARS-COV-2 Vaccination 04/01/2019, 04/22/2019, 10/16/2019   Td 09/02/2007   Tdap 10/08/2017   Unspecified SARS-COV-2 Vaccination 12/13/2021    TDAP status: Up to date  Flu Vaccine status: Declined, Education has been provided regarding the importance of this vaccine but patient still declined. Advised may receive this vaccine at local pharmacy or Health Dept. Aware to provide a copy of the vaccination record if obtained from local pharmacy or Health Dept. Verbalized acceptance and understanding.  Pneumococcal vaccine status: Declined,  Education has been provided regarding the importance of this vaccine but patient still declined. Advised may receive this vaccine at local pharmacy or Health Dept. Aware to provide a copy of the vaccination record if obtained from local pharmacy or Health Dept. Verbalized acceptance and understanding.   Covid-19 vaccine status: Completed vaccines  Qualifies for Shingles Vaccine? Yes   Zostavax completed No   Shingrix Completed?: No.    Education has been provided regarding the importance of this vaccine. Patient has been advised to call insurance company to determine out of pocket expense if they have not yet received this vaccine. Advised may also receive vaccine at local pharmacy or Health Dept. Verbalized acceptance and understanding.  Screening Tests Health Maintenance  Topic Date Due   COVID-19 Vaccine (5 - 2023-24 season) 02/07/2022   INFLUENZA VACCINE  04/07/2022 (Originally 08/06/2021)   Zoster Vaccines- Shingrix (1 of 2) 06/04/2022 (Originally [birth year]/1974)   Pneumonia Vaccine 87+ Years old (1 of 1 - PCV) 11/23/2022 (Originally 08/16/2020)   DEXA SCAN  11/23/2022 (Originally 08/16/2020)   MAMMOGRAM  03/24/2022   Medicare Annual Wellness (AWV)  03/17/2023   COLONOSCOPY (Pts 45-11yr Insurance coverage will need to be confirmed)  12/20/2024   DTaP/Tdap/Td (3 - Td or Tdap) 10/09/2027   HPV VACCINES  Aged Out   Hepatitis C  Screening  Discontinued    Health Maintenance  Health Maintenance Due  Topic Date Due   COVID-19 Vaccine (5 - 2023-24 season) 02/07/2022    Colorectal cancer screening: Type of screening: Colonoscopy. Completed 12/20/21. Repeat every 3 years  Mammogram status: Completed 03/23/20. Repeat every year    Additional Screening:  Hepatitis C Screening: does not qualify;  Vision Screening: Recommended annual ophthalmology exams for early detection of glaucoma and other disorders of the eye. Is the patient up to date with their annual eye exam?  Yes  Who is the provider or what is the name of the office in which the patient attends annual [place] exams[place]  If pt is not established with a provider, would they like to be referred to a provider to establish care? No .   Dental Screening: Recommended annual dental exams for proper  oral hygiene  Community Resource Referral / Chronic Care Management: CRR required this visit?  No   CCM required this visit?  No      Plan:     I have personally reviewed and noted the following in the patient's chart:   Medical and social history Use of alcohol, tobacco or illicit drugs  Current medications and supplements including opioid prescriptions. Patient is not currently taking opioid prescriptions. Functional ability and status Nutritional status Physical activity Advanced directives List of other physicians Hospitalizations, surgeries, and ER visits in previous 12 months Vitals Screenings to include cognitive, depression, and falls Referrals and appointments  In addition, I have reviewed and discussed with patient certain preventive protocols, quality metrics, and best practice recommendations. A written personalized care plan for preventive services as well as general preventive health recommendations were provided to patient.     Willette Brace, LPN   [id number]   Nurse Notes: Pt declined 6 CIT at this time and answered  questions asked with knowledge

## 2022-03-20 ENCOUNTER — Telehealth: Payer: Self-pay | Admitting: *Deleted

## 2022-03-20 ENCOUNTER — Encounter: Payer: Self-pay | Admitting: *Deleted

## 2022-03-20 DIAGNOSIS — C50412 Malignant neoplasm of upper-outer quadrant of left female breast: Secondary | ICD-10-CM

## 2022-03-20 DIAGNOSIS — Z170 Estrogen receptor positive status [ER+]: Secondary | ICD-10-CM

## 2022-03-20 NOTE — Telephone Encounter (Signed)
Received oncotype results of 19/6%. Spoke with patient and she is aware. Referral placed for Dr. Sondra Come.

## 2022-03-21 NOTE — Progress Notes (Signed)
Radiation Oncology         [phone] ________________________________  Name: Michelle Howell MRN: [id number]  Date: 03/24/2022  DOB: 10/11/1955  Re-Evaluation Note  CC: Marin Olp, MD  Nicholas Lose, MD  No diagnosis found.  Diagnosis: Stage IA (cT1b, cN0, cM0) Left Breast UOQ, Invasive and in situ ductal carcinoma, ER+ / PR+ / Her2-, Grade 1: s/p lumpectomy   Narrative:  The patient returns today to discuss radiation treatment options. She was seen in the multidisciplinary breast clinic on 01/22/22.   She underwent genetic testing on her consultation date. Results showed no clinically significant variants detected by BRCAplus or +RNAinsight testing.  She opted to proceed with a left breast lumpectomy without nodal biopsies on 02/10/22 under the care of Dr. Donne Hazel. Pathology from the procedure revealed: tumor the size of 1 cm; histology of grade 1 invasive ductal carcinoma with low grade DCIS; all margins negative for both invasive and in situ carcinoma. Prognostic indicators significant for: estrogen receptor 100% positive with strong staining intensity; progesterone receptor 90% positive with weak-moderate staining intensity; Proliferation marker Ki67 at <1%; Her2 status negative; Grade 1.   Oncotype DX was obtained on the final surgical sample and the recurrence score of 19 predicts a risk of recurrence outside the breast over the next 9 years of 6%, if the patient's only systemic therapy were to be an antiestrogen for 5 years. It also predicted no significant benefit from chemotherapy.  The patient will follow-up with Dr. Lindi Adie in the near future to further discuss antiestrogen treatment options.   On review of systems, the patient reports ***. She denies *** and any other symptoms.    Allergies:  is allergic to meperidine hcl and penicillins.  Meds: Current Outpatient Medications  Medication Sig Dispense Refill   amLODipine (NORVASC) 5 MG tablet Take 1 tablet (5  mg total) by mouth daily. 90 tablet 3   Ascorbic Acid (VITAMIN C) 1000 MG tablet Take 1,000 mg by mouth daily.     buPROPion (WELLBUTRIN XL) 300 MG 24 hr tablet TAKE 1 TABLET BY MOUTH DAILY 90 tablet 0   Cholecalciferol (VITAMIN D) 2000 UNITS tablet Take 2,000 Units by mouth daily.     Coenzyme Q10 (CO Q 10 PO) Take by mouth daily.     MAGNESIUM PO Take by mouth.     Misc Natural Products (YUMVS BEET ROOT-TART CHERRY PO) Take by mouth.     Omega 3-6-9 Fatty Acids (OMEGA-3-6-9 PO) Take by mouth daily.     PINE BARK, PYCNOGENOL, PO Take by mouth daily.     potassium chloride (MICRO-K) 10 MEQ CR capsule Take 10 mEq by mouth 2 (two) times daily.     Red Yeast Rice Extract (RED YEAST RICE PO) Take by mouth daily.     rosuvastatin (CRESTOR) 10 MG tablet Take 1 tablet (10 mg total) by mouth daily. 90 tablet 3   No current facility-administered medications for this encounter.    Physical Findings: The patient is in no acute distress. Patient is alert and oriented.  vitals were not taken for this visit.  No significant changes. Lungs are clear to auscultation bilaterally. Heart has regular rate and rhythm. No palpable cervical, supraclavicular, or axillary adenopathy. Abdomen soft, non-tender, normal bowel sounds. Right Breast: no palpable mass, nipple discharge or bleeding. Left Breast: ***  Lab Findings: Lab Results  Component Value Date   WBC 4.2 01/22/2022   HGB 13.6 01/22/2022   HCT 39.1 01/22/2022   MCV  91.1 01/22/2022   PLT 267 01/22/2022    Radiographic Findings: DG Hand Complete Left  Result Date: 03/08/2022 CLINICAL DATA:  Left thumb pain status post fall 1 week ago. EXAM: LEFT HAND - COMPLETE 3+ VIEW COMPARISON:  None Available. FINDINGS: Moderate to severe thumb carpometacarpal joint space narrowing, subchondral sclerosis, subchondral cystic change, and peripheral osteophytosis. Mild thumb interphalangeal joint and second through fifth DIP joint peripheral spurring. No acute  fracture or dislocation. IMPRESSION: 1. No acute fracture. 2. Moderate to severe thumb carpometacarpal osteoarthritis. Electronically Signed   By: Yvonne Kendall M.D.   On: 03/08/2022 18:13    Impression: Stage IA (cT1b, cN0, cM0) Left Breast UOQ, Invasive and in situ ductal carcinoma, ER+ / PR+ / Her2-, Grade 1: s/p lumpectomy    ***  Plan:  Patient is scheduled for CT simulation {date/later today}. ***  -----------------------------------  Blair Promise, PhD, MD  This document serves as a record of services personally performed by Gery Pray, MD. It was created on his behalf by Roney Mans, a trained medical scribe. The creation of this record is based on the scribe's personal observations and the provider's statements to them. This document has been checked and approved by the attending provider.

## 2022-03-21 NOTE — Progress Notes (Signed)
Location of Breast Cancer: Malignant neoplasm of upper-outer quadrant of left breast in female, estrogen receptor positive   Histology per Pathology Report:  FINAL MICROSCOPIC DIAGNOSIS:  A. BREAST, LEFT W/SEED, LUMPECTOMY: Invasive ductal carcinoma, 1 cm, grade 1 Ductal carcinoma in situ: Present, cribriform, low nuclear grade Margins, invasive: Negative     Closest, invasive: 14 mm, anterior Margins, DCIS: Negative     Closest, DCIS: 16 mm, anterior Lymphovascular invasion: Not identified Prognostic markers:  ER 100%, positive, PR 90%, positive, Her2 negative, Ki-67 less than 1% Other: None See oncology table  B. BREAST, LEFT ADDITIONAL SUPERIOR MARGIN, EXCISION: Benign adipose tissue Superior margin negative for carcinoma  C. BREAST, LEFT ADDITIONAL POSTERIOR MARGIN, EXCISION: Benign adipose tissue Posterior margin negative for carcinoma  D. BREAST, LEFT ADDITIONAL MEDIAL MARGIN, EXCISION: Benign adipose tissue Medial margin negative for carcinoma  E. BREAST, LEFT ADDITIONAL LATERAL MARGIN, EXCISION: Benign adipose tissue Lateral margin negative for carcinoma  ONCOLOGY TABLE: INVASIVE CARCINOMA OF THE BREAST:  Resection Procedure: Left breast lumpectomy with additional margin biopsies Specimen Laterality: Left Histologic Type: Ductal Histologic Grade:      Glandular (Acinar)/Tubular Differentiation: 2      Nuclear Pleomorphism: 1      Mitotic Rate: 1      Overall Grade: 1 Tumor Size: 1 x 0.8 x 0.8 cm Ductal Carcinoma In Situ: Present, cribriform, low nuclear grade Lymphatic and/or Vascular Invasion: Not identified Treatment Effect in the Breast: No known presurgical therapy Margins: All margins negative for invasive carcinoma      Distance from Closest Margin (mm): 14 mm      Specify Closest Margin (required only if <11mm): Anterior DCIS Margins: All margins negative for DCIS      Distance from Closest Margin (mm): 16 mm      Specify Closest Margin (required  only if <66mm): Anterior Regional Lymph Nodes: No lymph nodes submitted Distant Metastasis:      Distant Site(s) Involved: Not applicable Breast Biomarker Testing Performed on Previous Biopsy:      Testing Performed on Case Number: [id number]            Estrogen Receptor: 100%, positive, strong staining            Progesterone Receptor: 90%, positive, weak-moderate staining            HER2: Equivocal with IHC.  Negative with FISH, ratio 1.14            Ki-67: Less than 1% Pathologic Stage Classification (pTNM, AJCC 8th Edition): pT1b, pN not assigned Representative Tumor Block: A2-A4 (v4.5.0.0)   Receptor Status: ER(100% positive), PR (90% positive), Her2-neu (neg), Ki-67(less than 1%)  Did patient present with symptoms (if so, please note symptoms) or was this found on screening mammography?: screening mammogram  Past/Anticipated interventions by surgeon, if any: 02-10-22 Procedure: Left breast radioactive seed guided lumpectomy Surgeon: Dr. Serita Grammes  Past/Anticipated interventions by medical oncology, if any:   Dr. Lindi Adie on 03-06-22 SUMMARY OF ONCOLOGIC HISTORY:    Oncology History  Malignant neoplasm of upper-outer quadrant of left breast in female, estrogen receptor positive ([place])  01/10/2022 Initial Diagnosis    Screening mammogram detected left breast distortion and mass polyp-like position measuring 0.9 cm, 1 abnormal lymph node biopsy: Benign concordant, breast biopsy: Grade 1 IDC ER 100%, PR 90%, Ki-67 < 1%, HER2 2+ by IHC FISH negative    01/22/2022 Cancer Staging    Staging form: Breast, AJCC 8th Edition - Clinical: Stage IA (cT1b, cN0, cM0, G1, ER+,  PR+, HER2-) - Signed by Nicholas Lose, MD on 01/22/2022 Stage prefix: Initial diagnosis Histologic grading system: 3 grade system    02/03/2022 Genetic Testing    Negative [facility] Multi-Cancer +RNA Panel.  Report date is 02/03/2022.    The Multi-Cancer + RNA Panel offered by [facility] includes sequencing and/or  deletion/duplication analysis of the following 70 genes:  AIP*, ALK, APC*, ATM*, AXIN2*, BAP1*, BARD1*, BLM*, BMPR1A*, BRCA1*, BRCA2*, BRIP1*, CDC73*, CDH1*, CDK4, CDKN1B*, CDKN2A, CHEK2*, CTNNA1*, DICER1*, EPCAM (del/dup only), EGFR, FH*, FLCN*, GREM1 (promoter dup only), HOXB13, KIT, LZTR1, MAX*, MBD4, MEN1*, MET, MITF, MLH1*, MSH2*, MSH3*, MSH6*, MUTYH*, NF1*, NF2*, NTHL1*, PALB2*, PDGFRA, PMS2*, POLD1*, POLE*, POT1*, PRKAR1A*, PTCH1*, PTEN*, RAD51C*, RAD51D*, RB1*, RET, SDHA* (sequencing only), SDHAF2*, SDHB*, SDHC*, SDHD*, SMAD4*, SMARCA4*, SMARCB1*, SMARCE1*, STK11*, SUFU*, TMEM127*, TP53*, TSC1*, TSC2*, VHL*. RNA analysis is performed for * genes.    02/10/2022 Surgery    Left lumpectomy: Grade 1 IDC 1 cm with low-grade DCIS, margins negative, ER 100%, PR 90%, HER2 negative, Ki-67 less than 1%, no lymph nodes evaluated     ASSESSMENT & PLAN:  Malignant neoplasm of upper-outer quadrant of left breast in female, estrogen receptor positive ([place]) 02/10/22: Left lumpectomy: Grade 1 IDC 1 cm with low-grade DCIS, margins negative, ER 100%, PR 90%, HER2 negative, Ki-67 less than 1%, no lymph nodes evaluated.   Pathology counseling: I discussed the final pathology report of the patient provided  a copy of this report. I discussed the margins as well as lymph node surgeries. We also discussed the final staging along with previously performed ER/PR and HER-2/neu testing.   Treatment plan: Oncotype DX testing to determine if chemotherapy would be of any benefit followed by 2. Adjuvant radiation therapy followed by 3. Adjuvant antiestrogen therapy   RTC based upon Oncotype DX test result    Harriette Ohara, MD 02/24/22   Lymphedema issues, if any:  {:18581} {t:21944}   Pain issues, if any:  {:18581} {PAIN DESCRIPTION:21022940}  SAFETY ISSUES: Prior radiation? {:18581} Pacemaker/ICD? {:18581} Possible current pregnancy?{:18581} Is the patient on methotrexate? {:18581}  Current Complaints / other  details:  ***

## 2022-03-24 ENCOUNTER — Ambulatory Visit
Admission: RE | Admit: 2022-03-24 | Discharge: 2022-03-24 | Disposition: A | Discharge status: Home / Self Care | Payer: PPO | Source: Ambulatory Visit | Attending: Radiation Oncology | Admitting: Radiation Oncology

## 2022-03-24 ENCOUNTER — Encounter: Payer: Self-pay | Admitting: *Deleted

## 2022-03-24 ENCOUNTER — Encounter: Payer: Self-pay | Admitting: Radiation Oncology

## 2022-03-24 VITALS — BP 146/94 | HR 78 | Temp 97.9°F | Resp 20 | Ht 66.0 in | Wt 182.6 lb

## 2022-03-24 DIAGNOSIS — Z79899 Other long term (current) drug therapy: Secondary | ICD-10-CM | POA: Insufficient documentation

## 2022-03-24 DIAGNOSIS — Z170 Estrogen receptor positive status [ER+]: Secondary | ICD-10-CM | POA: Diagnosis not present

## 2022-03-24 DIAGNOSIS — C50412 Malignant neoplasm of upper-outer quadrant of left female breast: Secondary | ICD-10-CM | POA: Diagnosis not present

## 2022-03-25 ENCOUNTER — Encounter (HOSPITAL_COMMUNITY): Payer: Self-pay

## 2022-03-25 ENCOUNTER — Other Ambulatory Visit: Payer: Self-pay

## 2022-03-25 ENCOUNTER — Ambulatory Visit
Admission: RE | Admit: 2022-03-25 | Discharge: 2022-03-25 | Disposition: A | Discharge status: Home / Self Care | Payer: PPO | Source: Ambulatory Visit | Attending: Radiation Oncology | Admitting: Radiation Oncology

## 2022-03-25 DIAGNOSIS — C50412 Malignant neoplasm of upper-outer quadrant of left female breast: Secondary | ICD-10-CM | POA: Diagnosis not present

## 2022-03-25 DIAGNOSIS — Z170 Estrogen receptor positive status [ER+]: Secondary | ICD-10-CM

## 2022-03-25 DIAGNOSIS — Z510 Encounter for antineoplastic radiation therapy: Secondary | ICD-10-CM | POA: Diagnosis not present

## 2022-03-27 DIAGNOSIS — Z170 Estrogen receptor positive status [ER+]: Secondary | ICD-10-CM | POA: Diagnosis not present

## 2022-03-27 DIAGNOSIS — Z510 Encounter for antineoplastic radiation therapy: Secondary | ICD-10-CM | POA: Diagnosis not present

## 2022-03-27 DIAGNOSIS — C50412 Malignant neoplasm of upper-outer quadrant of left female breast: Secondary | ICD-10-CM | POA: Diagnosis not present

## 2022-03-31 ENCOUNTER — Encounter: Payer: Self-pay | Admitting: *Deleted

## 2022-04-01 ENCOUNTER — Ambulatory Visit
Admission: RE | Admit: 2022-04-01 | Discharge: 2022-04-01 | Disposition: A | Discharge status: Home / Self Care | Payer: PPO | Source: Ambulatory Visit | Attending: Radiation Oncology | Admitting: Radiation Oncology

## 2022-04-01 ENCOUNTER — Telehealth: Payer: Self-pay | Admitting: Hematology and Oncology

## 2022-04-01 ENCOUNTER — Other Ambulatory Visit: Payer: Self-pay

## 2022-04-01 DIAGNOSIS — Z170 Estrogen receptor positive status [ER+]: Secondary | ICD-10-CM | POA: Diagnosis not present

## 2022-04-01 DIAGNOSIS — C50412 Malignant neoplasm of upper-outer quadrant of left female breast: Secondary | ICD-10-CM | POA: Diagnosis not present

## 2022-04-01 DIAGNOSIS — Z510 Encounter for antineoplastic radiation therapy: Secondary | ICD-10-CM | POA: Diagnosis not present

## 2022-04-01 LAB — RAD ONC ARIA SESSION SUMMARY
Course Elapsed Days: 0
Plan Fractions Treated to Date: 1
Plan Prescribed Dose Per Fraction: 2.67 Gy
Plan Total Fractions Prescribed: 16
Plan Total Prescribed Dose: 42.72 Gy
Reference Point Dosage Given to Date: 2.67 Gy
Reference Point Session Dosage Given: 2.67 Gy
Session Number: 1

## 2022-04-01 MED ORDER — ALRA NON-METALLIC DEODORANT (RAD-ONC)
1.0000 | Freq: Once | TOPICAL | Status: AC
  Administered 2022-04-01: 1 via TOPICAL

## 2022-04-01 MED ORDER — RADIAPLEXRX EX GEL: Freq: Once | CUTANEOUS | Status: AC

## 2022-04-01 NOTE — Telephone Encounter (Signed)
Scheduled appointment per scheduling message. Patient is aware of the made appointment. 

## 2022-04-02 ENCOUNTER — Other Ambulatory Visit: Payer: Self-pay

## 2022-04-02 ENCOUNTER — Ambulatory Visit
Admission: RE | Admit: 2022-04-02 | Discharge: 2022-04-02 | Disposition: A | Discharge status: Home / Self Care | Payer: PPO | Source: Ambulatory Visit | Attending: Radiation Oncology | Admitting: Radiation Oncology

## 2022-04-02 DIAGNOSIS — C50412 Malignant neoplasm of upper-outer quadrant of left female breast: Secondary | ICD-10-CM | POA: Diagnosis not present

## 2022-04-02 DIAGNOSIS — Z510 Encounter for antineoplastic radiation therapy: Secondary | ICD-10-CM | POA: Diagnosis not present

## 2022-04-02 DIAGNOSIS — Z170 Estrogen receptor positive status [ER+]: Secondary | ICD-10-CM | POA: Diagnosis not present

## 2022-04-02 LAB — RAD ONC ARIA SESSION SUMMARY
Course Elapsed Days: 1
Plan Fractions Treated to Date: 2
Plan Prescribed Dose Per Fraction: 2.67 Gy
Plan Total Fractions Prescribed: 16
Plan Total Prescribed Dose: 42.72 Gy
Reference Point Dosage Given to Date: 5.34 Gy
Reference Point Session Dosage Given: 2.67 Gy
Session Number: 2

## 2022-04-03 ENCOUNTER — Other Ambulatory Visit: Payer: Self-pay

## 2022-04-03 ENCOUNTER — Ambulatory Visit
Admission: RE | Admit: 2022-04-03 | Discharge: 2022-04-03 | Disposition: A | Discharge status: Home / Self Care | Payer: PPO | Source: Ambulatory Visit | Attending: Radiation Oncology | Admitting: Radiation Oncology

## 2022-04-03 DIAGNOSIS — Z170 Estrogen receptor positive status [ER+]: Secondary | ICD-10-CM | POA: Diagnosis not present

## 2022-04-03 DIAGNOSIS — Z510 Encounter for antineoplastic radiation therapy: Secondary | ICD-10-CM | POA: Diagnosis not present

## 2022-04-03 DIAGNOSIS — C50412 Malignant neoplasm of upper-outer quadrant of left female breast: Secondary | ICD-10-CM | POA: Diagnosis not present

## 2022-04-03 LAB — RAD ONC ARIA SESSION SUMMARY
Course Elapsed Days: 2
Plan Fractions Treated to Date: 3
Plan Prescribed Dose Per Fraction: 2.67 Gy
Plan Total Fractions Prescribed: 16
Plan Total Prescribed Dose: 42.72 Gy
Reference Point Dosage Given to Date: 8.01 Gy
Reference Point Session Dosage Given: 2.67 Gy
Session Number: 3

## 2022-04-03 NOTE — Progress Notes (Signed)
   I, Josepha Pigg, CMA acting as a scribe for Michelle Leader, MD.  Michelle Howell is a 67 y.o. female who presents to [place] at [facility] today for 1 month follow-up left thumb pain after falling on an outstretched hand.  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  Of note, pt underwent a L breast lumpectomy on 02/10/22. Patient was last seen by Dr. Georgina Snell on 03/06/2022 and was advised to immobilize with a thumb spica splint.  Today, patient reports some improvement of left thumb sx. Feels like sx have started to turn the corner over the past week or so. Continues to wear brace, some skin irritation. Continues to have some weakness and decreased ROM.   Her hand injury occurred on February 21.  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  Dx imaging: 03/06/2022 L hand x-ray  Pertinent review of systems: No fevers or chills  Relevant historical information: Breast cancer left side status postlumpectomy currently continuing with radiation therapy.   Exam:  BP (!) 140/84   Pulse 97   Ht 5\' 6"  (1.676 m)   Wt 179 lb 12.8 oz (81.6 kg)   LMP  (LMP Unknown)   SpO2 97%   BMI 29.02 kg/m  General: Well Developed, well nourished, and in no acute distress.   MSK: Left thumb: Swelling at MCP.  Slight decreased range of motion to flexion at MCP.  No laxity to UCL stress test.    Lab and Radiology Results  EXAM: LEFT HAND - COMPLETE 3+ VIEW   COMPARISON:  None Available.   FINDINGS: Moderate to severe thumb carpometacarpal joint space narrowing, subchondral sclerosis, subchondral cystic change, and peripheral osteophytosis. Mild thumb interphalangeal joint and second through fifth DIP joint peripheral spurring.   No acute fracture or dislocation.   IMPRESSION: 1. No acute fracture. 2. Moderate to severe thumb carpometacarpal osteoarthritis.     Electronically Signed   By: Yvonne Kendall M.D.   On: 03/08/2022 18:13 I,  Michelle Howell, personally (independently) visualized and performed the interpretation of the images attached in this note.     Assessment and Plan: 68 y.o. female with left thumb injury thought to be UCL tear.  Significantly improved with 1 month of immobilization.  She is already starting her home exercise program which is appropriate.  Agree with targeted return to work date of April 10.  Will complete disability paperwork when she brings up by the office.  Dates for this injury should be February 21-April 10.  She has been out of work longer than this due to her breast cancer but for my related care target April 10 return to work.  If needed offered occupational therapy referral.  Recheck back as needed.   PDMP not reviewed this encounter. No orders of the defined types were placed in this encounter.  No orders of the defined types were placed in this encounter.    Discussed warning signs or symptoms. Please see discharge instructions. Patient expresses understanding.   The above documentation has been reviewed and is accurate and complete Michelle Howell, M.D.

## 2022-04-04 ENCOUNTER — Other Ambulatory Visit: Payer: Self-pay

## 2022-04-04 ENCOUNTER — Ambulatory Visit
Admission: RE | Admit: 2022-04-04 | Discharge: 2022-04-04 | Disposition: A | Discharge status: Home / Self Care | Payer: PPO | Source: Ambulatory Visit | Attending: Radiation Oncology | Admitting: Radiation Oncology

## 2022-04-04 DIAGNOSIS — C50412 Malignant neoplasm of upper-outer quadrant of left female breast: Secondary | ICD-10-CM | POA: Diagnosis not present

## 2022-04-04 DIAGNOSIS — Z510 Encounter for antineoplastic radiation therapy: Secondary | ICD-10-CM | POA: Diagnosis not present

## 2022-04-04 DIAGNOSIS — Z170 Estrogen receptor positive status [ER+]: Secondary | ICD-10-CM | POA: Diagnosis not present

## 2022-04-04 LAB — RAD ONC ARIA SESSION SUMMARY
Course Elapsed Days: 3
Plan Fractions Treated to Date: 4
Plan Prescribed Dose Per Fraction: 2.67 Gy
Plan Total Fractions Prescribed: 16
Plan Total Prescribed Dose: 42.72 Gy
Reference Point Dosage Given to Date: 10.68 Gy
Reference Point Session Dosage Given: 2.67 Gy
Session Number: 4

## 2022-04-07 ENCOUNTER — Other Ambulatory Visit: Payer: Self-pay

## 2022-04-07 ENCOUNTER — Ambulatory Visit (INDEPENDENT_AMBULATORY_CARE_PROVIDER_SITE_OTHER): Payer: PPO | Admitting: Family Medicine

## 2022-04-07 ENCOUNTER — Ambulatory Visit
Admission: RE | Admit: 2022-04-07 | Discharge: 2022-04-07 | Disposition: A | Discharge status: Home / Self Care | Payer: PPO | Source: Ambulatory Visit | Attending: Radiation Oncology | Admitting: Radiation Oncology

## 2022-04-07 ENCOUNTER — Other Ambulatory Visit: Payer: Self-pay | Admitting: Family Medicine

## 2022-04-07 ENCOUNTER — Encounter: Payer: Self-pay | Admitting: Family Medicine

## 2022-04-07 VITALS — BP 140/84 | HR 97 | Ht 66.0 in | Wt 179.8 lb

## 2022-04-07 DIAGNOSIS — S63642D Sprain of metacarpophalangeal joint of left thumb, subsequent encounter: Secondary | ICD-10-CM | POA: Diagnosis not present

## 2022-04-07 DIAGNOSIS — Z510 Encounter for antineoplastic radiation therapy: Secondary | ICD-10-CM | POA: Diagnosis not present

## 2022-04-07 DIAGNOSIS — Z170 Estrogen receptor positive status [ER+]: Secondary | ICD-10-CM | POA: Diagnosis not present

## 2022-04-07 DIAGNOSIS — C50412 Malignant neoplasm of upper-outer quadrant of left female breast: Secondary | ICD-10-CM | POA: Diagnosis not present

## 2022-04-07 LAB — RAD ONC ARIA SESSION SUMMARY
Course Elapsed Days: 6
Plan Fractions Treated to Date: 5
Plan Prescribed Dose Per Fraction: 2.67 Gy
Plan Total Fractions Prescribed: 16
Plan Total Prescribed Dose: 42.72 Gy
Reference Point Dosage Given to Date: 13.35 Gy
Reference Point Session Dosage Given: 2.67 Gy
Session Number: 5

## 2022-04-07 NOTE — Patient Instructions (Addendum)
Thank you for coming in today.   OK to resume activity and work on 04/16/22 Use [employer].   Let me know if you want some hand therapy.

## 2022-04-08 ENCOUNTER — Ambulatory Visit
Admission: RE | Admit: 2022-04-08 | Discharge: 2022-04-08 | Disposition: A | Discharge status: Home / Self Care | Payer: PPO | Source: Ambulatory Visit | Attending: Radiation Oncology | Admitting: Radiation Oncology

## 2022-04-08 ENCOUNTER — Other Ambulatory Visit: Payer: Self-pay

## 2022-04-08 DIAGNOSIS — Z510 Encounter for antineoplastic radiation therapy: Secondary | ICD-10-CM | POA: Diagnosis not present

## 2022-04-08 DIAGNOSIS — C50412 Malignant neoplasm of upper-outer quadrant of left female breast: Secondary | ICD-10-CM | POA: Diagnosis not present

## 2022-04-08 DIAGNOSIS — M19011 Primary osteoarthritis, right shoulder: Secondary | ICD-10-CM | POA: Diagnosis not present

## 2022-04-08 DIAGNOSIS — Z170 Estrogen receptor positive status [ER+]: Secondary | ICD-10-CM | POA: Diagnosis not present

## 2022-04-08 LAB — RAD ONC ARIA SESSION SUMMARY
Course Elapsed Days: 7
Plan Fractions Treated to Date: 6
Plan Prescribed Dose Per Fraction: 2.67 Gy
Plan Total Fractions Prescribed: 16
Plan Total Prescribed Dose: 42.72 Gy
Reference Point Dosage Given to Date: 16.02 Gy
Reference Point Session Dosage Given: 2.67 Gy
Session Number: 6

## 2022-04-09 ENCOUNTER — Ambulatory Visit
Admission: RE | Admit: 2022-04-09 | Discharge: 2022-04-09 | Disposition: A | Discharge status: Home / Self Care | Payer: PPO | Source: Ambulatory Visit | Attending: Radiation Oncology | Admitting: Radiation Oncology

## 2022-04-09 ENCOUNTER — Other Ambulatory Visit: Payer: Self-pay | Admitting: Orthopaedic Surgery

## 2022-04-09 ENCOUNTER — Encounter: Payer: Self-pay | Admitting: *Deleted

## 2022-04-09 ENCOUNTER — Other Ambulatory Visit: Payer: Self-pay

## 2022-04-09 DIAGNOSIS — Z510 Encounter for antineoplastic radiation therapy: Secondary | ICD-10-CM | POA: Diagnosis not present

## 2022-04-09 DIAGNOSIS — C50412 Malignant neoplasm of upper-outer quadrant of left female breast: Secondary | ICD-10-CM | POA: Diagnosis not present

## 2022-04-09 DIAGNOSIS — Z01818 Encounter for other preprocedural examination: Secondary | ICD-10-CM

## 2022-04-09 DIAGNOSIS — Z170 Estrogen receptor positive status [ER+]: Secondary | ICD-10-CM | POA: Diagnosis not present

## 2022-04-09 LAB — RAD ONC ARIA SESSION SUMMARY
Course Elapsed Days: 8
Plan Fractions Treated to Date: 7
Plan Prescribed Dose Per Fraction: 2.67 Gy
Plan Total Fractions Prescribed: 16
Plan Total Prescribed Dose: 42.72 Gy
Reference Point Dosage Given to Date: 18.69 Gy
Reference Point Session Dosage Given: 2.67 Gy
Session Number: 7

## 2022-04-09 NOTE — Progress Notes (Signed)
RN successfully faxed completed preoperative risk assessment to [facility] [phone]).

## 2022-04-10 ENCOUNTER — Ambulatory Visit
Admission: RE | Admit: 2022-04-10 | Discharge: 2022-04-10 | Disposition: A | Discharge status: Home / Self Care | Payer: PPO | Source: Ambulatory Visit | Attending: Radiation Oncology | Admitting: Radiation Oncology

## 2022-04-10 ENCOUNTER — Other Ambulatory Visit: Payer: Self-pay

## 2022-04-10 DIAGNOSIS — C50412 Malignant neoplasm of upper-outer quadrant of left female breast: Secondary | ICD-10-CM | POA: Diagnosis not present

## 2022-04-10 DIAGNOSIS — Z170 Estrogen receptor positive status [ER+]: Secondary | ICD-10-CM | POA: Diagnosis not present

## 2022-04-10 DIAGNOSIS — Z510 Encounter for antineoplastic radiation therapy: Secondary | ICD-10-CM | POA: Diagnosis not present

## 2022-04-10 LAB — RAD ONC ARIA SESSION SUMMARY
Course Elapsed Days: 9
Plan Fractions Treated to Date: 8
Plan Prescribed Dose Per Fraction: 2.67 Gy
Plan Total Fractions Prescribed: 16
Plan Total Prescribed Dose: 42.72 Gy
Reference Point Dosage Given to Date: 21.36 Gy
Reference Point Session Dosage Given: 2.67 Gy
Session Number: 8

## 2022-04-11 ENCOUNTER — Other Ambulatory Visit: Payer: Self-pay

## 2022-04-11 ENCOUNTER — Ambulatory Visit
Admission: RE | Admit: 2022-04-11 | Discharge: 2022-04-11 | Disposition: A | Discharge status: Home / Self Care | Payer: PPO | Source: Ambulatory Visit | Attending: Radiation Oncology | Admitting: Radiation Oncology

## 2022-04-11 DIAGNOSIS — C50412 Malignant neoplasm of upper-outer quadrant of left female breast: Secondary | ICD-10-CM | POA: Diagnosis not present

## 2022-04-11 DIAGNOSIS — Z170 Estrogen receptor positive status [ER+]: Secondary | ICD-10-CM | POA: Diagnosis not present

## 2022-04-11 DIAGNOSIS — Z510 Encounter for antineoplastic radiation therapy: Secondary | ICD-10-CM | POA: Diagnosis not present

## 2022-04-11 LAB — RAD ONC ARIA SESSION SUMMARY
Course Elapsed Days: 10
Plan Fractions Treated to Date: 9
Plan Prescribed Dose Per Fraction: 2.67 Gy
Plan Total Fractions Prescribed: 16
Plan Total Prescribed Dose: 42.72 Gy
Reference Point Dosage Given to Date: 24.03 Gy
Reference Point Session Dosage Given: 2.67 Gy
Session Number: 9

## 2022-04-14 ENCOUNTER — Other Ambulatory Visit: Payer: Self-pay

## 2022-04-14 ENCOUNTER — Telehealth: Payer: Self-pay

## 2022-04-14 ENCOUNTER — Ambulatory Visit
Admission: RE | Admit: 2022-04-14 | Discharge: 2022-04-14 | Disposition: A | Discharge status: Home / Self Care | Payer: PPO | Source: Ambulatory Visit | Attending: Radiation Oncology | Admitting: Radiation Oncology

## 2022-04-14 DIAGNOSIS — C50412 Malignant neoplasm of upper-outer quadrant of left female breast: Secondary | ICD-10-CM | POA: Diagnosis not present

## 2022-04-14 DIAGNOSIS — Z510 Encounter for antineoplastic radiation therapy: Secondary | ICD-10-CM | POA: Diagnosis not present

## 2022-04-14 DIAGNOSIS — Z170 Estrogen receptor positive status [ER+]: Secondary | ICD-10-CM | POA: Diagnosis not present

## 2022-04-14 LAB — RAD ONC ARIA SESSION SUMMARY
Course Elapsed Days: 13
Plan Fractions Treated to Date: 10
Plan Prescribed Dose Per Fraction: 2.67 Gy
Plan Total Fractions Prescribed: 16
Plan Total Prescribed Dose: 42.72 Gy
Reference Point Dosage Given to Date: 26.7 Gy
Reference Point Session Dosage Given: 2.67 Gy
Session Number: 10

## 2022-04-14 NOTE — Telephone Encounter (Signed)
From completed and returned to the front desk.   Pt will pick up form tomorrow morning.

## 2022-04-15 ENCOUNTER — Ambulatory Visit
Admission: RE | Admit: 2022-04-15 | Discharge: 2022-04-15 | Disposition: A | Discharge status: Home / Self Care | Payer: PPO | Source: Ambulatory Visit | Attending: Radiation Oncology | Admitting: Radiation Oncology

## 2022-04-15 ENCOUNTER — Other Ambulatory Visit: Payer: Self-pay

## 2022-04-15 DIAGNOSIS — Z170 Estrogen receptor positive status [ER+]: Secondary | ICD-10-CM | POA: Diagnosis not present

## 2022-04-15 DIAGNOSIS — Z510 Encounter for antineoplastic radiation therapy: Secondary | ICD-10-CM | POA: Diagnosis not present

## 2022-04-15 DIAGNOSIS — C50412 Malignant neoplasm of upper-outer quadrant of left female breast: Secondary | ICD-10-CM | POA: Diagnosis not present

## 2022-04-15 LAB — RAD ONC ARIA SESSION SUMMARY
Course Elapsed Days: 14
Plan Fractions Treated to Date: 11
Plan Prescribed Dose Per Fraction: 2.67 Gy
Plan Total Fractions Prescribed: 16
Plan Total Prescribed Dose: 42.72 Gy
Reference Point Dosage Given to Date: 29.37 Gy
Reference Point Session Dosage Given: 2.67 Gy
Session Number: 11

## 2022-04-16 ENCOUNTER — Telehealth: Payer: Self-pay

## 2022-04-16 ENCOUNTER — Ambulatory Visit
Admission: RE | Admit: 2022-04-16 | Discharge: 2022-04-16 | Disposition: A | Discharge status: Home / Self Care | Payer: PPO | Source: Ambulatory Visit | Attending: Radiation Oncology | Admitting: Radiation Oncology

## 2022-04-16 ENCOUNTER — Other Ambulatory Visit: Payer: Self-pay

## 2022-04-16 DIAGNOSIS — Z510 Encounter for antineoplastic radiation therapy: Secondary | ICD-10-CM | POA: Diagnosis not present

## 2022-04-16 DIAGNOSIS — Z170 Estrogen receptor positive status [ER+]: Secondary | ICD-10-CM | POA: Diagnosis not present

## 2022-04-16 DIAGNOSIS — C50412 Malignant neoplasm of upper-outer quadrant of left female breast: Secondary | ICD-10-CM | POA: Diagnosis not present

## 2022-04-16 LAB — RAD ONC ARIA SESSION SUMMARY
Course Elapsed Days: 15
Plan Fractions Treated to Date: 12
Plan Prescribed Dose Per Fraction: 2.67 Gy
Plan Total Fractions Prescribed: 16
Plan Total Prescribed Dose: 42.72 Gy
Reference Point Dosage Given to Date: 32.04 Gy
Reference Point Session Dosage Given: 2.67 Gy
Session Number: 12

## 2022-04-16 NOTE — Telephone Encounter (Signed)
Please schedule OV for surgery clearance, we received form from [facility].

## 2022-04-16 NOTE — Telephone Encounter (Signed)
See below

## 2022-04-16 NOTE — Telephone Encounter (Signed)
8 20 on Thursday

## 2022-04-16 NOTE — Telephone Encounter (Signed)
Next avl with Dr Durene Cal is 4/22 same day apt - Is Dr Durene Cal willing to work her in?- Surgery : Shoulder rep. Patient wants to be seen sooner.

## 2022-04-17 ENCOUNTER — Ambulatory Visit
Admission: RE | Admit: 2022-04-17 | Discharge: 2022-04-17 | Disposition: A | Discharge status: Home / Self Care | Payer: PPO | Source: Ambulatory Visit | Attending: Radiation Oncology | Admitting: Radiation Oncology

## 2022-04-17 ENCOUNTER — Other Ambulatory Visit: Payer: Self-pay

## 2022-04-17 DIAGNOSIS — Z170 Estrogen receptor positive status [ER+]: Secondary | ICD-10-CM | POA: Diagnosis not present

## 2022-04-17 DIAGNOSIS — Z510 Encounter for antineoplastic radiation therapy: Secondary | ICD-10-CM | POA: Diagnosis not present

## 2022-04-17 DIAGNOSIS — C50412 Malignant neoplasm of upper-outer quadrant of left female breast: Secondary | ICD-10-CM | POA: Diagnosis not present

## 2022-04-17 LAB — RAD ONC ARIA SESSION SUMMARY
Course Elapsed Days: 16
Plan Fractions Treated to Date: 13
Plan Prescribed Dose Per Fraction: 2.67 Gy
Plan Total Fractions Prescribed: 16
Plan Total Prescribed Dose: 42.72 Gy
Reference Point Dosage Given to Date: 34.71 Gy
Reference Point Session Dosage Given: 2.67 Gy
Session Number: 13

## 2022-04-17 NOTE — Telephone Encounter (Signed)
11 40 on 19th but I will likely be running behind at that time as heads up

## 2022-04-17 NOTE — Telephone Encounter (Signed)
See below

## 2022-04-18 ENCOUNTER — Ambulatory Visit
Admission: RE | Admit: 2022-04-18 | Discharge: 2022-04-18 | Disposition: A | Discharge status: Home / Self Care | Payer: PPO | Source: Ambulatory Visit | Attending: Radiation Oncology | Admitting: Radiation Oncology

## 2022-04-18 ENCOUNTER — Other Ambulatory Visit: Payer: Self-pay

## 2022-04-18 DIAGNOSIS — Z170 Estrogen receptor positive status [ER+]: Secondary | ICD-10-CM | POA: Diagnosis not present

## 2022-04-18 DIAGNOSIS — C50412 Malignant neoplasm of upper-outer quadrant of left female breast: Secondary | ICD-10-CM | POA: Diagnosis not present

## 2022-04-18 DIAGNOSIS — Z510 Encounter for antineoplastic radiation therapy: Secondary | ICD-10-CM | POA: Diagnosis not present

## 2022-04-18 LAB — RAD ONC ARIA SESSION SUMMARY
Course Elapsed Days: 17
Plan Fractions Treated to Date: 14
Plan Prescribed Dose Per Fraction: 2.67 Gy
Plan Total Fractions Prescribed: 16
Plan Total Prescribed Dose: 42.72 Gy
Reference Point Dosage Given to Date: 37.38 Gy
Reference Point Session Dosage Given: 2.67 Gy
Session Number: 14

## 2022-04-21 ENCOUNTER — Ambulatory Visit
Admission: RE | Admit: 2022-04-21 | Discharge: 2022-04-21 | Disposition: A | Discharge status: Home / Self Care | Payer: PPO | Source: Ambulatory Visit | Attending: Radiation Oncology | Admitting: Radiation Oncology

## 2022-04-21 ENCOUNTER — Ambulatory Visit: Payer: PPO

## 2022-04-21 ENCOUNTER — Other Ambulatory Visit: Payer: Self-pay

## 2022-04-21 DIAGNOSIS — Z510 Encounter for antineoplastic radiation therapy: Secondary | ICD-10-CM | POA: Diagnosis not present

## 2022-04-21 DIAGNOSIS — Z170 Estrogen receptor positive status [ER+]: Secondary | ICD-10-CM | POA: Diagnosis not present

## 2022-04-21 DIAGNOSIS — C50412 Malignant neoplasm of upper-outer quadrant of left female breast: Secondary | ICD-10-CM | POA: Diagnosis not present

## 2022-04-21 LAB — RAD ONC ARIA SESSION SUMMARY
Course Elapsed Days: 20
Plan Fractions Treated to Date: 15
Plan Prescribed Dose Per Fraction: 2.67 Gy
Plan Total Fractions Prescribed: 16
Plan Total Prescribed Dose: 42.72 Gy
Reference Point Dosage Given to Date: 40.05 Gy
Reference Point Session Dosage Given: 2.67 Gy
Session Number: 15

## 2022-04-22 ENCOUNTER — Other Ambulatory Visit: Payer: Self-pay

## 2022-04-22 ENCOUNTER — Ambulatory Visit: Payer: PPO

## 2022-04-22 ENCOUNTER — Encounter: Payer: Self-pay | Admitting: *Deleted

## 2022-04-22 ENCOUNTER — Ambulatory Visit
Admission: RE | Admit: 2022-04-22 | Discharge: 2022-04-22 | Disposition: A | Discharge status: Home / Self Care | Payer: PPO | Source: Ambulatory Visit | Attending: Radiation Oncology | Admitting: Radiation Oncology

## 2022-04-22 DIAGNOSIS — C50412 Malignant neoplasm of upper-outer quadrant of left female breast: Secondary | ICD-10-CM | POA: Diagnosis not present

## 2022-04-22 DIAGNOSIS — Z510 Encounter for antineoplastic radiation therapy: Secondary | ICD-10-CM | POA: Diagnosis not present

## 2022-04-22 DIAGNOSIS — Z170 Estrogen receptor positive status [ER+]: Secondary | ICD-10-CM

## 2022-04-22 LAB — RAD ONC ARIA SESSION SUMMARY
Course Elapsed Days: 21
Plan Fractions Treated to Date: 16
Plan Prescribed Dose Per Fraction: 2.67 Gy
Plan Total Fractions Prescribed: 16
Plan Total Prescribed Dose: 42.72 Gy
Reference Point Dosage Given to Date: 42.72 Gy
Reference Point Session Dosage Given: 2.67 Gy
Session Number: 16

## 2022-04-25 ENCOUNTER — Encounter: Payer: Self-pay | Admitting: Family Medicine

## 2022-04-25 ENCOUNTER — Ambulatory Visit (INDEPENDENT_AMBULATORY_CARE_PROVIDER_SITE_OTHER): Payer: PPO | Admitting: Family Medicine

## 2022-04-25 VITALS — BP 138/84 | HR 79 | Temp 97.4°F | Ht 66.0 in | Wt 178.0 lb

## 2022-04-25 DIAGNOSIS — E785 Hyperlipidemia, unspecified: Secondary | ICD-10-CM | POA: Diagnosis not present

## 2022-04-25 DIAGNOSIS — I10 Essential (primary) hypertension: Secondary | ICD-10-CM

## 2022-04-25 DIAGNOSIS — I700 Atherosclerosis of aorta: Secondary | ICD-10-CM

## 2022-04-25 NOTE — Radiation Completion Notes (Signed)
Patient Name: Michelle Howell, Michelle Howell MRN: [id number] Date of Birth: 1955-10-03 Referring Physician: Serena Croissant, M.D. Date of Service: 2022-04-25 Radiation Oncologist: Arnette Schaumann, M.D. [facility] [place]                             RADIATION ONCOLOGY END OF TREATMENT NOTE     Diagnosis: C50.412 Malignant neoplasm of upper-outer quadrant of left female breast Staging on 2022-01-22: Malignant neoplasm of upper-outer quadrant of left breast in female, estrogen receptor positive T=cT1b, N=cN0, M=cM0 Intent: Curative     ==========DELIVERED PLANS==========  First Treatment Date: 2022-04-01 - Last Treatment Date: 2022-04-22   Plan Name: Breast_L_BH Site: Breast, Left Technique: 3D Mode: Photon Dose Per Fraction: 2.67 Gy Prescribed Dose (Delivered / Prescribed): 42.72 Gy / 42.72 Gy Prescribed Fxs (Delivered / Prescribed): 16 / 16     ==========ON TREATMENT VISIT DATES========== 2022-04-01, 2022-04-08, 2022-04-15     ==========UPCOMING VISITS==========       ==========APPENDIX - ON TREATMENT VISIT NOTES==========   See weekly On Treatment Notes is Epic for details.

## 2022-04-25 NOTE — Progress Notes (Signed)
Patient Care Team: Shelva Majestic, MD as PCP - General (Family Medicine) Pershing Proud, RN as Oncology Nurse Navigator Donnelly Angelica, RN as Oncology Nurse Navigator Serena Croissant, MD as Consulting Physician (Hematology and Oncology) Emelia Loron, MD as Consulting Physician (General Surgery) Antony Blackbird, MD as Consulting Physician (Radiation Oncology)  DIAGNOSIS:  Encounter Diagnosis  Name Primary?   Malignant neoplasm of upper-outer quadrant of left breast in female, estrogen receptor positive Yes    SUMMARY OF ONCOLOGIC HISTORY: Oncology History  Malignant neoplasm of upper-outer quadrant of left breast in female, estrogen receptor positive  01/10/2022 Initial Diagnosis   Screening mammogram detected left breast distortion and mass polyp-like position measuring 0.9 cm, 1 abnormal lymph node biopsy: Benign concordant, breast biopsy: Grade 1 IDC ER 100%, PR 90%, Ki-67 < 1%, HER2 2+ by IHC FISH negative   01/22/2022 Cancer Staging   Staging form: Breast, AJCC 8th Edition - Clinical: Stage IA (cT1b, cN0, cM0, G1, ER+, PR+, HER2-) - Signed by Serena Croissant, MD on 01/22/2022 Stage prefix: Initial diagnosis Histologic grading system: 3 grade system   02/03/2022 Genetic Testing   Negative [facility] Multi-Cancer +RNA Panel.  Report date is 02/03/2022.   The Multi-Cancer + RNA Panel offered by [facility] includes sequencing and/or deletion/duplication analysis of the following 70 genes:  AIP*, ALK, APC*, ATM*, AXIN2*, BAP1*, BARD1*, BLM*, BMPR1A*, BRCA1*, BRCA2*, BRIP1*, CDC73*, CDH1*, CDK4, CDKN1B*, CDKN2A, CHEK2*, CTNNA1*, DICER1*, EPCAM (del/dup only), EGFR, FH*, FLCN*, GREM1 (promoter dup only), HOXB13, KIT, LZTR1, MAX*, MBD4, MEN1*, MET, MITF, MLH1*, MSH2*, MSH3*, MSH6*, MUTYH*, NF1*, NF2*, NTHL1*, PALB2*, PDGFRA, PMS2*, POLD1*, POLE*, POT1*, PRKAR1A*, PTCH1*, PTEN*, RAD51C*, RAD51D*, RB1*, RET, SDHA* (sequencing only), SDHAF2*, SDHB*, SDHC*, SDHD*, SMAD4*, SMARCA4*, SMARCB1*,  SMARCE1*, STK11*, SUFU*, TMEM127*, TP53*, TSC1*, TSC2*, VHL*. RNA analysis is performed for * genes.   02/10/2022 Surgery   Left lumpectomy: Grade 1 IDC 1 cm with low-grade DCIS, margins negative, ER 100%, PR 90%, HER2 negative, Ki-67 less than 1%, no lymph nodes evaluated   03/17/2022 Oncotype testing   Oncotype DX recurrence score 19: Distant recurrence at 9 years: 6%     CHIEF COMPLIANT: Post xrt  INTERVAL HISTORY: Michelle Howell is a 67 y.o. female is here because of recent diagnosis of left breast cancer. Adjuvant radiation therapy followed by Adjuvant antiestrogen therapy.  She reports that radiation went well. She does have some itching, peeling and some radiation dermatitis.    ALLERGIES:  is allergic to meperidine hcl and penicillins.  MEDICATIONS:  Current Outpatient Medications  Medication Sig Dispense Refill   amLODipine (NORVASC) 5 MG tablet Take 1 tablet (5 mg total) by mouth daily. 90 tablet 3   anastrozole (ARIMIDEX) 1 MG tablet Take 1 tablet (1 mg total) by mouth daily. 90 tablet 3   Ascorbic Acid (VITAMIN C) 1000 MG tablet Take 1,000 mg by mouth daily.     buPROPion (WELLBUTRIN XL) 300 MG 24 hr tablet TAKE 1 TABLET BY MOUTH DAILY 90 tablet 0   Cholecalciferol (VITAMIN D) 2000 UNITS tablet Take 2,000 Units by mouth daily.     Coenzyme Q10 (CO Q 10 PO) Take by mouth daily.     Ginseng 100 MG CAPS Take 100 capsules by mouth daily.     MAGNESIUM PO Take by mouth.     Misc Natural Products (YUMVS BEET ROOT-TART CHERRY PO) Take by mouth.     Omega 3-6-9 Fatty Acids (OMEGA-3-6-9 PO) Take by mouth daily.     PINE BARK, PYCNOGENOL, PO  Take by mouth daily.     potassium chloride (MICRO-K) 10 MEQ CR capsule Take 10 mEq by mouth 2 (two) times daily.     Red Yeast Rice Extract (RED YEAST RICE PO) Take by mouth daily.     rosuvastatin (CRESTOR) 10 MG tablet Take 1 tablet (10 mg total) by mouth daily. 90 tablet 3   No current facility-administered medications for this visit.     PHYSICAL EXAMINATION: ECOG PERFORMANCE STATUS: 1 - Symptomatic but completely ambulatory  Vitals:   04/28/22 1515  BP: (!) 148/87  Pulse: 84  Resp: 18  Temp: 97.8 F (36.6 C)  SpO2: 98%   Filed Weights   04/28/22 1515  Weight: 178 lb 12.8 oz (81.1 kg)      LABORATORY DATA:  I have reviewed the data as listed    Latest Ref Rng & Units 01/22/2022    8:30 AM 11/22/2021    3:50 PM 05/24/2020    4:22 PM  CMP  Glucose 70 - 99 mg/dL 95  80  80   BUN 8 - 23 mg/dL 15  16  14    Creatinine 0.44 - 1.00 mg/dL 0.96  0.45  4.09   Sodium 135 - 145 mmol/L 138  138  138   Potassium 3.5 - 5.1 mmol/L 4.1  3.9  4.5   Chloride 98 - 111 mmol/L 104  102  98   CO2 22 - 32 mmol/L 29  25  23    Calcium 8.9 - 10.3 mg/dL 81.1  91.4  9.8   Total Protein 6.5 - 8.1 g/dL 6.6  7.1  7.3   Total Bilirubin 0.3 - 1.2 mg/dL 0.8  0.5  0.4   Alkaline Phos 38 - 126 U/L 63   96   AST 15 - 41 U/L 22  28  24    ALT 0 - 44 U/L 32  32  25     Lab Results  Component Value Date   WBC 4.2 01/22/2022   HGB 13.6 01/22/2022   HCT 39.1 01/22/2022   MCV 91.1 01/22/2022   PLT 267 01/22/2022   NEUTROABS 2.4 01/22/2022    ASSESSMENT & PLAN:  Malignant neoplasm of upper-outer quadrant of left breast in female, estrogen receptor positive (HCC) 02/10/22: Left lumpectomy: Grade 1 IDC 1 cm with low-grade DCIS, margins negative, ER 100%, PR 90%, HER2 negative, Ki-67 less than 1%, no lymph nodes evaluated.  03/17/2022: Oncotype DX score 19: 6% risk of distant recurrence Adjuvant radiation completed 04/22/2022  Treatment plan: Adjuvant antiestrogen therapy with anastrozole 1 mg daily x 5 to 7 years  Anastrozole counseling: We discussed the risks and benefits of anti-estrogen therapy with aromatase inhibitors. These include but not limited to insomnia, hot flashes, mood changes, vaginal dryness, bone density loss, and weight gain. We strongly believe that the benefits far outweigh the risks. Patient understands these risks  and consented to starting treatment. Planned treatment duration is 5-7 years.  Return to clinic in 3 months for survivorship care plan visit    Orders Placed This Encounter  Procedures   DG Bone Density    Standing Status:   Future    Standing Expiration Date:   04/28/2023    Order Specific Question:   Reason for Exam (SYMPTOM  OR DIAGNOSIS REQUIRED)    Answer:   postmenopausal    Order Specific Question:   Preferred imaging location?    Answer:   [facility]   The patient has a good understanding of the  overall plan. she agrees with it. she will call with any problems that may develop before the next visit here. Total time spent: 30 mins including face to face time and time spent for planning, charting and co-ordination of care   Tamsen Meek, MD 04/28/22    I Janan Ridge am acting as a Neurosurgeon for [employer]  I have reviewed the above documentation for accuracy and completeness, and I agree with the above.

## 2022-04-25 NOTE — Patient Instructions (Addendum)
Blood pressure is above ideal goal 135/85 but at least under 140/90 so we can proceed with surgery. I like your idea of cutting down on salt to reduce further   Will send in clearance form for surgery  Recommended follow up: Return in about 3 months (around 07/25/2022) for followup or sooner if needed.Schedule b4 you leave.

## 2022-04-25 NOTE — Progress Notes (Signed)
Phone [phone] In person visit   Subjective:   Michelle Howell is a 67 y.o. year old very pleasant female patient who presents for/with See problem oriented charting Chief Complaint  Patient presents with   Surgical clearance    For right shoulder.   Past Medical History-  Patient Active Problem List   Diagnosis Date Noted   Malignant neoplasm of upper-outer quadrant of left breast in female, estrogen receptor positive 01/21/2022    Priority: High   Aortic atherosclerosis 07/13/2020    Priority: Medium    Essential hypertension 11/24/2017    Priority: Medium    Hyperlipidemia 08/12/2013    Priority: Medium    Genetic testing 02/03/2022    Priority: Low   GERD (gastroesophageal reflux disease) 10/10/2017    Priority: Low   Arthritis of left ankle 08/12/2013    Priority: Low   Gamekeeper's thumb, left 03/06/2022    Medications- reviewed and updated Current Outpatient Medications  Medication Sig Dispense Refill   amLODipine (NORVASC) 5 MG tablet Take 1 tablet (5 mg total) by mouth daily. 90 tablet 3   Ascorbic Acid (VITAMIN C) 1000 MG tablet Take 1,000 mg by mouth daily.     buPROPion (WELLBUTRIN XL) 300 MG 24 hr tablet TAKE 1 TABLET BY MOUTH DAILY 90 tablet 0   Cholecalciferol (VITAMIN D) 2000 UNITS tablet Take 2,000 Units by mouth daily.     Coenzyme Q10 (CO Q 10 PO) Take by mouth daily.     Ginseng 100 MG CAPS Take 100 capsules by mouth daily.     MAGNESIUM PO Take by mouth.     Misc Natural Products (YUMVS BEET ROOT-TART CHERRY PO) Take by mouth.     Omega 3-6-9 Fatty Acids (OMEGA-3-6-9 PO) Take by mouth daily.     PINE BARK, PYCNOGENOL, PO Take by mouth daily.     potassium chloride (MICRO-K) 10 MEQ CR capsule Take 10 mEq by mouth 2 (two) times daily.     Red Yeast Rice Extract (RED YEAST RICE PO) Take by mouth daily.     rosuvastatin (CRESTOR) 10 MG tablet Take 1 tablet (10 mg total) by mouth daily. 90 tablet 3   No current facility-administered medications  for this visit.     Objective:  BP 138/84   Pulse 79   Temp (!) 97.4 F (36.3 C) (Temporal)   Ht  (1.676 m)   Wt 178 lb (80.7 kg)   LMP  (LMP Unknown)   SpO2 98%   BMI 28.73 kg/m  Gen: NAD, resting comfortably CV: RRR no murmurs rubs or gallops Lungs: CTAB no crackles, wheeze, rhonchi  Ext: trace edema Skin: warm, dry     Assessment and Plan   # Preoperative surgical evaluation S: Patient has planned surgery with Vedia Pereyra right shoulder with Dr. Everardo Pacific -played tennis Wednesday night for long match and no chest pain or shortness of breath . Could easily climb 2 flights of stair without chest pain or shortness of breath  - likely in June or July for surgery A/P: Patient can complete 4 mets of activity without chest pain or shortness of breath . Her blood pressure has improved on recheck and <140/90 reasonable for surgery though would prefer <135/85 in long run- see below but is going to make some lifestyle adjustments . Also CT calcium scoring <100 within 2 years is encouraging in regards to cardiac risk   #ductal carcinoma- off biopsy 01/10/22 status postlumpectomy to 524 with adjuvant radiation and adjuvant  antiestrogen therapy - just finished radiation and doing reasonably well  #hypertension S: medication: Amlodipine 5 mg (started 2.5 mg May 24, 2020.) No increased swelling Home readings #s: not checking  - a lot of stress lately and feels could be running things back -also lost one of her dogs recently BP Readings from Last 3 Encounters:  04/25/22 138/84  04/07/22 (!) 140/84  03/24/22 (!) 146/94  A/P: prior blood pressure readings were initial checks and no repeat were completed. Initial blood pressure high here but on repeat within acceptable range.  We opted to continue current medication -we discussed adding an arb/ace-I or hydrochlorothiazide but she would prefer to work on reducing salt intake/dash eating plan as reports has some excess salt in diet    #hyperlipidemia with coronary artery calcium score greater than 80 at 81st percentile for age in 2022 #Aortic atherosclerosis S: Medication: Rosuvastatin 5 mg daily--> 10 mg plus red yeast rice Lab Results  Component Value Date   CHOL 192 11/22/2021   HDL 78 11/22/2021   LDLCALC 97 11/22/2021   TRIG 79 11/22/2021   CHOLHDL 2.5 11/22/2021  A/P: Strongly suspect lipids have improved but even with prior LDL level would not preclude surgery-we opted to check lipids with next of blood work (not yet due)  Recommended follow up: Return in about 3 months (around 07/25/2022) for followup or sooner if needed.Schedule b4 you leave. Future Appointments  Date Time Provider Department Center  04/28/2022  3:15 PM Serena Croissant, MD [facility] None  05/22/2022  9:00 AM Antony Blackbird, MD [facility] None  05/26/2022  3:20 PM [facility] 1 [facility] [facility]  08/22/2022  3:20 PM Shelva Majestic, MD [facility]  03/23/2023 10:15 AM [facility] ANNUAL WELLNESS VISIT 1 [facility]    Lab/Order associations:   ICD-10-CM   1. Essential hypertension  I10     2. Hyperlipidemia, unspecified hyperlipidemia type  E78.5     3. Aortic atherosclerosis  I70.0      Return precautions advised.  Tana Conch, MD

## 2022-04-28 ENCOUNTER — Inpatient Hospital Stay: Payer: PPO | Attending: Hematology and Oncology | Admitting: Hematology and Oncology

## 2022-04-28 ENCOUNTER — Other Ambulatory Visit: Payer: Self-pay

## 2022-04-28 VITALS — BP 148/87 | HR 84 | Temp 97.8°F | Resp 18 | Ht 66.0 in | Wt 178.8 lb

## 2022-04-28 DIAGNOSIS — Z79811 Long term (current) use of aromatase inhibitors: Secondary | ICD-10-CM | POA: Insufficient documentation

## 2022-04-28 DIAGNOSIS — Z170 Estrogen receptor positive status [ER+]: Secondary | ICD-10-CM | POA: Insufficient documentation

## 2022-04-28 DIAGNOSIS — Z923 Personal history of irradiation: Secondary | ICD-10-CM | POA: Insufficient documentation

## 2022-04-28 DIAGNOSIS — Z79899 Other long term (current) drug therapy: Secondary | ICD-10-CM | POA: Insufficient documentation

## 2022-04-28 DIAGNOSIS — C50412 Malignant neoplasm of upper-outer quadrant of left female breast: Secondary | ICD-10-CM | POA: Diagnosis not present

## 2022-04-28 MED ORDER — ANASTROZOLE 1 MG PO TABS: 1.0000 mg | ORAL_TABLET | Freq: Every day | ORAL | 3 refills | Status: DC

## 2022-04-28 NOTE — Assessment & Plan Note (Signed)
02/10/22: Left lumpectomy: Grade 1 IDC 1 cm with low-grade DCIS, margins negative, ER 100%, PR 90%, HER2 negative, Ki-67 less than 1%, no lymph nodes evaluated.  03/17/2022: Oncotype DX score 19: 6% risk of distant recurrence Adjuvant radiation completed 04/22/2022  Treatment plan: Adjuvant antiestrogen therapy with anastrozole 1 mg daily x 5 to 7 years  Anastrozole counseling: We discussed the risks and benefits of anti-estrogen therapy with aromatase inhibitors. These include but not limited to insomnia, hot flashes, mood changes, vaginal dryness, bone density loss, and weight gain. We strongly believe that the benefits far outweigh the risks. Patient understands these risks and consented to starting treatment. Planned treatment duration is 5-7 years.  Return to clinic in 3 months for survivorship care plan visit

## 2022-05-09 ENCOUNTER — Other Ambulatory Visit: Payer: PPO

## 2022-05-16 ENCOUNTER — Encounter: Payer: Self-pay | Admitting: Radiation Oncology

## 2022-05-16 NOTE — Progress Notes (Signed)
  Radiation Oncology         ([phone] ________________________________  Name: Michelle Howell MRN: [id number]  Date: 05/19/2022  DOB: 1955-03-07  End of Treatment Note  Diagnosis: Stage IA (cT1b, cN0, cM0) Left Breast UOQ, Invasive and in situ ductal carcinoma, ER+ / PR+ / Her2-, Grade 1: s/p lumpectomy      Indication for treatment: Curative        Radiation treatment dates: 04/01/22 through 04/22/22   Site/dose: Left Breast - 42.72 Gy delivered in 16 Fx at 2.67 Gy/Fx  Beams/energy: 6X-FFF, 10X-FFF  Narrative: The patient tolerated radiation treatment relatively well. During her final weekly treatment check on 04/15/22, the patient denied any side effects from treatment other than some fatigue. Physical exam performed on that same date showed some erythema between the upper inner aspect and inframammary fold of the left breast. No skin breakdown was appreciated. She was given radiaplex for her skin issues.  Plan: The patient has completed radiation treatment. The patient will return to radiation oncology clinic for routine followup in one month. I advised them to call or return sooner if they have any questions or concerns related to their recovery or treatment.  -----------------------------------  Billie Lade, PhD, MD  This document serves as a record of services personally performed by Antony Blackbird, MD. It was created on his behalf by Neena Rhymes, a trained medical scribe. The creation of this record is based on the scribe's personal observations and the provider's statements to them. This document has been checked and approved by the attending provider.

## 2022-05-18 NOTE — Progress Notes (Incomplete)
Radiation Oncology         [phone] ________________________________  Name: Michelle Howell MRN: [id number]  Date: 05/19/2022  DOB: May 23, 1955  Follow-Up Visit Note  CC: Shelva Majestic, MD  Shelva Majestic, MD  No diagnosis found.  Diagnosis: Stage IA (cT1b, cN0, cM0) Left Breast UOQ, Invasive and in situ ductal carcinoma, ER+ / PR+ / Her2-, Grade 1: s/p lumpectomy      Interval Since Last Radiation: 27 days   Indication for treatment: Curative       Radiation treatment dates: 04/01/22 through 04/22/22  Site/dose: Left Breast - 42.72 Gy delivered in 16 Fx at 2.67 Gy/Fx Beams/energy: 6X-FFF, 10X-FFF  Narrative:  The patient returns today for routine follow-up. The patient tolerated radiation treatment relatively well. During her final weekly treatment check on 04/15/22, the patient denied any side effects from treatment other than some fatigue. Physical exam performed on that same date showed some erythema between the upper inner aspect and inframammary fold of the left breast. No skin breakdown was appreciated. She was given radiaplex for her skin issues.      Since completing XRT, the patient followed up with Dr. Pamelia Hoit on 04/28/22. During which time, the patient reported having some ongoing radiation dermatitis, specifically characterized by peeling and itching. Antiestrogen treatment options were reviewed in detail at the time of this visit, and she has opted to proceed with antiestrogen therapy consisting of anastrozole.             ***                 Allergies:  is allergic to meperidine hcl and penicillins.  Meds: Current Outpatient Medications  Medication Sig Dispense Refill   amLODipine (NORVASC) 5 MG tablet Take 1 tablet (5 mg total) by mouth daily. 90 tablet 3   anastrozole (ARIMIDEX) 1 MG tablet Take 1 tablet (1 mg total) by mouth daily. 90 tablet 3   Ascorbic Acid (VITAMIN C) 1000 MG tablet Take 1,000 mg by mouth daily.     buPROPion (WELLBUTRIN XL) 300 MG  24 hr tablet TAKE 1 TABLET BY MOUTH DAILY 90 tablet 0   Cholecalciferol (VITAMIN D) 2000 UNITS tablet Take 2,000 Units by mouth daily.     Coenzyme Q10 (CO Q 10 PO) Take by mouth daily.     Ginseng 100 MG CAPS Take 100 capsules by mouth daily.     MAGNESIUM PO Take by mouth.     Misc Natural Products (YUMVS BEET ROOT-TART CHERRY PO) Take by mouth.     Omega 3-6-9 Fatty Acids (OMEGA-3-6-9 PO) Take by mouth daily.     PINE BARK, PYCNOGENOL, PO Take by mouth daily.     potassium chloride (MICRO-K) 10 MEQ CR capsule Take 10 mEq by mouth 2 (two) times daily.     Red Yeast Rice Extract (RED YEAST RICE PO) Take by mouth daily.     rosuvastatin (CRESTOR) 10 MG tablet Take 1 tablet (10 mg total) by mouth daily. 90 tablet 3   No current facility-administered medications for this encounter.    Physical Findings: The patient is in no acute distress. Patient is alert and oriented.  vitals were not taken for this visit. .  No significant changes. Lungs are clear to auscultation bilaterally. Heart has regular rate and rhythm. No palpable cervical, supraclavicular, or axillary adenopathy. Abdomen soft, non-tender, normal bowel sounds.  Right Breast: no palpable mass, nipple discharge or bleeding. Left Breast: ***  Lab Findings: Lab  Results  Component Value Date   WBC 4.2 01/22/2022   HGB 13.6 01/22/2022   HCT 39.1 01/22/2022   MCV 91.1 01/22/2022   PLT 267 01/22/2022    Radiographic Findings: No results found.  Impression: Stage IA (cT1b, cN0, cM0) Left Breast UOQ, Invasive and in situ ductal carcinoma, ER+ / PR+ / Her2-, Grade 1: s/p lumpectomy      The patient is recovering from the effects of radiation.  ***  Plan:  ***   *** minutes of total time was spent for this patient encounter, including preparation, face-to-face counseling with the patient and coordination of care, physical exam, and documentation of the encounter. ____________________________________  Billie Lade, PhD,  MD  This document serves as a record of services personally performed by Antony Blackbird, MD. It was created on his behalf by Neena Rhymes, a trained medical scribe. The creation of this record is based on the scribe's personal observations and the provider's statements to them. This document has been checked and approved by the attending provider.

## 2022-05-19 ENCOUNTER — Encounter: Payer: Self-pay | Admitting: Radiation Oncology

## 2022-05-19 ENCOUNTER — Ambulatory Visit
Admission: RE | Admit: 2022-05-19 | Discharge: 2022-05-19 | Disposition: A | Discharge status: Home / Self Care | Payer: PPO | Source: Ambulatory Visit | Attending: Radiation Oncology | Admitting: Radiation Oncology

## 2022-05-19 VITALS — BP 150/86 | HR 65 | Temp 97.5°F | Resp 20 | Wt 177.2 lb

## 2022-05-19 DIAGNOSIS — Z923 Personal history of irradiation: Secondary | ICD-10-CM | POA: Diagnosis not present

## 2022-05-19 DIAGNOSIS — Z170 Estrogen receptor positive status [ER+]: Secondary | ICD-10-CM | POA: Diagnosis not present

## 2022-05-19 DIAGNOSIS — C50412 Malignant neoplasm of upper-outer quadrant of left female breast: Secondary | ICD-10-CM | POA: Diagnosis not present

## 2022-05-19 HISTORY — DX: Personal history of irradiation: Z92.3

## 2022-05-19 NOTE — Progress Notes (Signed)
Michelle Howell is here today for follow up post radiation to the breast.   Breast Side: Left   They completed their radiation on: 04/22/22  Does the patient complain of any of the following: Post radiation skin issues: Skin has improved.  Breast Tenderness: No Breast Swelling: No Lymphadema: No Range of Motion limitations: No Fatigue post radiation: Yes, energy level is coming back.  Appetite good/fair/poor: Good   Additional comments if applicable  BP (!) 150/86   Pulse 65   Temp (!) 97.5 F (36.4 C)   Resp 20   Wt 177 lb 3.2 oz (80.4 kg)   LMP  (LMP Unknown)   SpO2 98%   BMI 28.60 kg/m  :

## 2022-05-22 ENCOUNTER — Ambulatory Visit: Payer: Self-pay | Admitting: Radiation Oncology

## 2022-05-26 ENCOUNTER — Ambulatory Visit
Admission: RE | Admit: 2022-05-26 | Discharge: 2022-05-26 | Disposition: A | Discharge status: Home / Self Care | Payer: PPO | Source: Ambulatory Visit | Attending: Orthopaedic Surgery | Admitting: Orthopaedic Surgery

## 2022-05-26 DIAGNOSIS — R911 Solitary pulmonary nodule: Secondary | ICD-10-CM | POA: Diagnosis not present

## 2022-05-26 DIAGNOSIS — Z01818 Encounter for other preprocedural examination: Secondary | ICD-10-CM

## 2022-05-26 DIAGNOSIS — M1611 Unilateral primary osteoarthritis, right hip: Secondary | ICD-10-CM | POA: Diagnosis not present

## 2022-06-09 DIAGNOSIS — M19011 Primary osteoarthritis, right shoulder: Secondary | ICD-10-CM | POA: Diagnosis not present

## 2022-06-09 DIAGNOSIS — Z01812 Encounter for preprocedural laboratory examination: Secondary | ICD-10-CM | POA: Diagnosis not present

## 2022-06-09 DIAGNOSIS — M25511 Pain in right shoulder: Secondary | ICD-10-CM | POA: Diagnosis not present

## 2022-06-18 ENCOUNTER — Encounter: Payer: Self-pay | Admitting: Orthopaedic Surgery

## 2022-06-18 ENCOUNTER — Telehealth: Payer: Self-pay | Admitting: Adult Health

## 2022-06-18 NOTE — Telephone Encounter (Signed)
Left patient a vm regarding upcoming appointment to get rescheduled per staff message

## 2022-06-19 ENCOUNTER — Telehealth: Payer: Self-pay | Admitting: Hematology and Oncology

## 2022-06-19 NOTE — Telephone Encounter (Signed)
Left patient a vm regarding upcoming appointment change  

## 2022-06-23 ENCOUNTER — Ambulatory Visit (HOSPITAL_BASED_OUTPATIENT_CLINIC_OR_DEPARTMENT_OTHER)
Admission: RE | Admit: 2022-06-23 | Discharge: 2022-06-23 | Disposition: A | Discharge status: Home / Self Care | Payer: PPO | Source: Ambulatory Visit | Attending: Hematology and Oncology | Admitting: Hematology and Oncology

## 2022-06-23 DIAGNOSIS — Z780 Asymptomatic menopausal state: Secondary | ICD-10-CM | POA: Diagnosis not present

## 2022-06-23 DIAGNOSIS — C50412 Malignant neoplasm of upper-outer quadrant of left female breast: Secondary | ICD-10-CM | POA: Insufficient documentation

## 2022-06-23 DIAGNOSIS — Z170 Estrogen receptor positive status [ER+]: Secondary | ICD-10-CM | POA: Diagnosis not present

## 2022-07-06 ENCOUNTER — Other Ambulatory Visit: Payer: Self-pay | Admitting: Family Medicine

## 2022-07-07 ENCOUNTER — Inpatient Hospital Stay: Payer: PPO | Admitting: Adult Health

## 2022-07-09 DIAGNOSIS — M85811 Other specified disorders of bone density and structure, right shoulder: Secondary | ICD-10-CM | POA: Diagnosis not present

## 2022-07-09 DIAGNOSIS — M19011 Primary osteoarthritis, right shoulder: Secondary | ICD-10-CM | POA: Diagnosis not present

## 2022-07-09 DIAGNOSIS — G8918 Other acute postprocedural pain: Secondary | ICD-10-CM | POA: Diagnosis not present

## 2022-07-14 DIAGNOSIS — M19011 Primary osteoarthritis, right shoulder: Secondary | ICD-10-CM | POA: Diagnosis not present

## 2022-07-14 DIAGNOSIS — M25611 Stiffness of right shoulder, not elsewhere classified: Secondary | ICD-10-CM | POA: Diagnosis not present

## 2022-07-14 DIAGNOSIS — M6281 Muscle weakness (generalized): Secondary | ICD-10-CM | POA: Diagnosis not present

## 2022-07-17 DIAGNOSIS — M19011 Primary osteoarthritis, right shoulder: Secondary | ICD-10-CM | POA: Diagnosis not present

## 2022-07-18 DIAGNOSIS — M25611 Stiffness of right shoulder, not elsewhere classified: Secondary | ICD-10-CM | POA: Diagnosis not present

## 2022-07-18 DIAGNOSIS — M19011 Primary osteoarthritis, right shoulder: Secondary | ICD-10-CM | POA: Diagnosis not present

## 2022-07-18 DIAGNOSIS — M6281 Muscle weakness (generalized): Secondary | ICD-10-CM | POA: Diagnosis not present

## 2022-07-22 DIAGNOSIS — M25611 Stiffness of right shoulder, not elsewhere classified: Secondary | ICD-10-CM | POA: Diagnosis not present

## 2022-07-22 DIAGNOSIS — M19011 Primary osteoarthritis, right shoulder: Secondary | ICD-10-CM | POA: Diagnosis not present

## 2022-07-22 DIAGNOSIS — M6281 Muscle weakness (generalized): Secondary | ICD-10-CM | POA: Diagnosis not present

## 2022-07-25 DIAGNOSIS — M6281 Muscle weakness (generalized): Secondary | ICD-10-CM | POA: Diagnosis not present

## 2022-07-25 DIAGNOSIS — M25611 Stiffness of right shoulder, not elsewhere classified: Secondary | ICD-10-CM | POA: Diagnosis not present

## 2022-07-25 DIAGNOSIS — M19011 Primary osteoarthritis, right shoulder: Secondary | ICD-10-CM | POA: Diagnosis not present

## 2022-07-28 ENCOUNTER — Encounter: Payer: PPO | Admitting: Adult Health

## 2022-07-29 DIAGNOSIS — M6281 Muscle weakness (generalized): Secondary | ICD-10-CM | POA: Diagnosis not present

## 2022-07-29 DIAGNOSIS — M19011 Primary osteoarthritis, right shoulder: Secondary | ICD-10-CM | POA: Diagnosis not present

## 2022-07-29 DIAGNOSIS — M25611 Stiffness of right shoulder, not elsewhere classified: Secondary | ICD-10-CM | POA: Diagnosis not present

## 2022-08-01 ENCOUNTER — Encounter: Payer: Self-pay | Admitting: Adult Health

## 2022-08-01 ENCOUNTER — Other Ambulatory Visit: Payer: Self-pay

## 2022-08-01 ENCOUNTER — Telehealth: Payer: Self-pay | Admitting: Adult Health

## 2022-08-01 ENCOUNTER — Inpatient Hospital Stay: Payer: PPO | Attending: Hematology and Oncology | Admitting: Adult Health

## 2022-08-01 VITALS — BP 131/85 | HR 88 | Temp 97.9°F | Resp 18 | Ht 66.0 in | Wt 178.8 lb

## 2022-08-01 DIAGNOSIS — Z79811 Long term (current) use of aromatase inhibitors: Secondary | ICD-10-CM | POA: Insufficient documentation

## 2022-08-01 DIAGNOSIS — Z8051 Family history of malignant neoplasm of kidney: Secondary | ICD-10-CM | POA: Insufficient documentation

## 2022-08-01 DIAGNOSIS — M19011 Primary osteoarthritis, right shoulder: Secondary | ICD-10-CM | POA: Diagnosis not present

## 2022-08-01 DIAGNOSIS — Z808 Family history of malignant neoplasm of other organs or systems: Secondary | ICD-10-CM | POA: Diagnosis not present

## 2022-08-01 DIAGNOSIS — R232 Flushing: Secondary | ICD-10-CM | POA: Insufficient documentation

## 2022-08-01 DIAGNOSIS — R911 Solitary pulmonary nodule: Secondary | ICD-10-CM | POA: Diagnosis not present

## 2022-08-01 DIAGNOSIS — Z170 Estrogen receptor positive status [ER+]: Secondary | ICD-10-CM

## 2022-08-01 DIAGNOSIS — M25611 Stiffness of right shoulder, not elsewhere classified: Secondary | ICD-10-CM | POA: Diagnosis not present

## 2022-08-01 DIAGNOSIS — C50412 Malignant neoplasm of upper-outer quadrant of left female breast: Secondary | ICD-10-CM | POA: Diagnosis not present

## 2022-08-01 DIAGNOSIS — M6281 Muscle weakness (generalized): Secondary | ICD-10-CM | POA: Diagnosis not present

## 2022-08-01 NOTE — Progress Notes (Signed)
SURVIVORSHIP VISIT:  BRIEF ONCOLOGIC HISTORY:  Oncology History  Malignant neoplasm of upper-outer quadrant of left breast in female, estrogen receptor positive (HCC)  01/10/2022 Initial Diagnosis   Screening mammogram detected left breast distortion and mass polyp-like position measuring 0.9 cm, 1 abnormal lymph node biopsy: Benign concordant, breast biopsy: Grade 1 IDC ER 100%, PR 90%, Ki-67 < 1%, HER2 2+ by IHC FISH negative   01/22/2022 Cancer Staging   Staging form: Breast, AJCC 8th Edition - Clinical: Stage IA (cT1b, cN0, cM0, G1, ER+, PR+, HER2-) - Signed by Serena Croissant, MD on 01/22/2022 Stage prefix: Initial diagnosis Histologic grading system: 3 grade system   02/03/2022 Genetic Testing   Negative [facility] Multi-Cancer +RNA Panel.  Report date is 02/03/2022.   The Multi-Cancer + RNA Panel offered by [facility] includes sequencing and/or deletion/duplication analysis of the following 70 genes:  AIP*, ALK, APC*, ATM*, AXIN2*, BAP1*, BARD1*, BLM*, BMPR1A*, BRCA1*, BRCA2*, BRIP1*, CDC73*, CDH1*, CDK4, CDKN1B*, CDKN2A, CHEK2*, CTNNA1*, DICER1*, EPCAM (del/dup only), EGFR, FH*, FLCN*, GREM1 (promoter dup only), HOXB13, KIT, LZTR1, MAX*, MBD4, MEN1*, MET, MITF, MLH1*, MSH2*, MSH3*, MSH6*, MUTYH*, NF1*, NF2*, NTHL1*, PALB2*, PDGFRA, PMS2*, POLD1*, POLE*, POT1*, PRKAR1A*, PTCH1*, PTEN*, RAD51C*, RAD51D*, RB1*, RET, SDHA* (sequencing only), SDHAF2*, SDHB*, SDHC*, SDHD*, SMAD4*, SMARCA4*, SMARCB1*, SMARCE1*, STK11*, SUFU*, TMEM127*, TP53*, TSC1*, TSC2*, VHL*. RNA analysis is performed for * genes.   02/10/2022 Surgery   Left lumpectomy: Grade 1 IDC 1 cm with low-grade DCIS, margins negative, ER 100%, PR 90%, HER2 negative, Ki-67 less than 1%, no lymph nodes evaluated   03/17/2022 Oncotype testing   Oncotype DX recurrence score 19: Distant recurrence at 9 years: 6%     INTERVAL HISTORY:  Michelle Howell to review her survivorship care plan detailing her treatment course for breast cancer, as well as  monitoring long-term side effects of that treatment, education regarding health maintenance, screening, and overall wellness and health promotion.     Overall, Michelle Howell reports feeling quite well.  She is taking Anastrozole daily.  She is experiences hot flashes.  She notes they are nightly at least 1-2 times per night.  She has some arm discoloration.  Her most recent bone density was normal.  She has no significant increase in arthralgias since starting the anastrozole.   REVIEW OF SYSTEMS:  Review of Systems  Constitutional:  Negative for appetite change, chills, fatigue, fever and unexpected weight change.  HENT:   Negative for hearing loss, lump/mass and trouble swallowing.   Eyes:  Negative for eye problems and icterus.  Respiratory:  Negative for chest tightness, cough and shortness of breath.   Cardiovascular:  Negative for chest pain, leg swelling and palpitations.  Gastrointestinal:  Negative for abdominal distention, abdominal pain, constipation, diarrhea, nausea and vomiting.  Endocrine: Positive for hot flashes.  Genitourinary:  Negative for difficulty urinating.   Musculoskeletal:  Negative for arthralgias.  Skin:  Negative for itching and rash.  Neurological:  Negative for dizziness, extremity weakness, headaches and numbness.  Hematological:  Negative for adenopathy. Does not bruise/bleed easily.  Psychiatric/Behavioral:  Negative for depression. The patient is not nervous/anxious.    Breast: Denies any new nodularity, masses, tenderness, nipple changes, or nipple discharge.       PAST MEDICAL/SURGICAL HISTORY:  Past Medical History:  Diagnosis Date   GERD (gastroesophageal reflux disease)    History of radiation therapy    Left breast- 04/01/22-04/22/22- Dr. Antony Blackbird   Hypertension    Past Surgical History:  Procedure Laterality Date   BREAST BIOPSY  Left 01/10/2022   [place] LT BREAST BX W LOC DEV 1ST LESION IMG BX SPEC US GUIDE 01/10/2022 [facility]    BREAST BIOPSY  02/06/2022   MM LT RADIOACTIVE SEED LOC MAMMO GUIDE 02/06/2022 [facility]   BREAST LUMPECTOMY WITH RADIOACTIVE SEED LOCALIZATION Left 02/10/2022   Procedure: LEFT BREAST LUMPECTOMY WITH RADIOACTIVE SEED LOCALIZATION;  Surgeon: Emelia Loron, MD;  [facility];  Service: General;  Laterality: Left;   dental implants  1985   KNEE SURGERY  1982   left   SHOULDER SURGERY  1991   right     ALLERGIES:  Allergies  Allergen Reactions   Meperidine Hcl Nausea And Vomiting   Penicillins     Per pt: unknown     CURRENT MEDICATIONS:  Outpatient Encounter Medications as of 08/01/2022  Medication Sig   amLODipine (NORVASC) 5 MG tablet Take 1 tablet (5 mg total) by mouth daily.   anastrozole (ARIMIDEX) 1 MG tablet Take 1 tablet (1 mg total) by mouth daily.   Ascorbic Acid (VITAMIN C) 1000 MG tablet Take 1,000 mg by mouth daily.   buPROPion (WELLBUTRIN XL) 300 MG 24 hr tablet TAKE 1 TABLET BY MOUTH DAILY   Cholecalciferol (VITAMIN D) 2000 UNITS tablet Take 2,000 Units by mouth daily.   Coenzyme Q10 (CO Q 10 PO) Take by mouth daily.   Ginseng 100 MG CAPS Take 100 capsules by mouth daily.   MAGNESIUM PO Take by mouth.   Misc Natural Products (YUMVS BEET ROOT-TART CHERRY PO) Take by mouth.   Omega 3-6-9 Fatty Acids (OMEGA-3-6-9 PO) Take by mouth daily.   PINE BARK, PYCNOGENOL, PO Take by mouth daily.   potassium chloride (MICRO-K) 10 MEQ CR capsule Take 10 mEq by mouth 2 (two) times daily.   Red Yeast Rice Extract (RED YEAST RICE PO) Take by mouth daily.   rosuvastatin (CRESTOR) 10 MG tablet Take 1 tablet (10 mg total) by mouth daily.   No facility-administered encounter medications on file as of 08/01/2022.     ONCOLOGIC FAMILY HISTORY:  Family History  Problem Relation Age of Onset   Hypertension Mother    Hyperlipidemia Mother    Heart disease Mother    Heart attack Mother    Heart disease Father    Hypertension Father    Heart failure  Father    Heart attack Father    Diabetes Sister    CAD Sister        minor heart attack in her 31s   Kidney disease Sister        required transplant after severe c diff   Diabetes Brother    Kidney cancer Brother        dx early 10s   Heart disease Brother        died at 10    Other Brother        back issues   CAD Brother        age 33   Brain cancer Paternal Aunt        d. after 45   Kidney cancer Cousin        paternal female cousin; dx after 41   Colon cancer Neg Hx    Colon polyps Neg Hx    Esophageal cancer Neg Hx    Rectal cancer Neg Hx    Stomach cancer Neg Hx      SOCIAL HISTORY:  Social History   Socioeconomic History   Marital status: Married  Spouse name: Not on file   Number of children: Not on file   Years of education: Not on file   Highest education level: Bachelor's degree (e.g., BA, AB, BS)  Occupational History   Not on file  Tobacco Use   Smoking status: Never   Smokeless tobacco: Never  Vaping Use   Vaping status: Never Used  Substance and Sexual Activity   Alcohol use: Yes    Alcohol/week: 14.0 standard drinks of alcohol    Types: 14 Cans of beer per week   Drug use: No   Sexual activity: Not on file  Other Topics Concern   Not on file  Social History Narrative   Married      Teacher, adult education      Hobbies: tennis   Social Determinants of Health   Financial Resource Strain: Low Risk  (04/24/2022)   Overall Financial Resource Strain (CARDIA)    Difficulty of Paying Living Expenses: Not hard at all  Food Insecurity: No Food Insecurity (04/24/2022)   Hunger Vital Sign    Worried About Running Out of Food in the Last Year: Never true    Ran Out of Food in the Last Year: Never true  Transportation Needs: No Transportation Needs (04/24/2022)   PRAPARE - Administrator, Civil Service (Medical): No    Lack of Transportation (Non-Medical): No  Physical Activity: Sufficiently Active (04/24/2022)   Exercise Vital Sign     Days of Exercise per Week: 5 days    Minutes of Exercise per Session: 50 min  Stress: No Stress Concern Present (04/24/2022)   [employer] of Occupational Health - Occupational Stress Questionnaire    Feeling of Stress : Not at all  Recent Concern: Stress - Stress Concern Present (03/17/2022)   [employer] of Occupational Health - Occupational Stress Questionnaire    Feeling of Stress : Rather much  Social Connections: Moderately Integrated (04/24/2022)   Social Connection and Isolation Panel [NHANES]    Frequency of Communication with Friends and Family: More than three times a week    Frequency of Social Gatherings with Friends and Family: More than three times a week    Attends Religious Services: Never    Database administrator or Organizations: Yes    Attends Engineer, structural: More than 4 times per year    Marital Status: Married  Catering manager Violence: Not At Risk (03/17/2022)   Humiliation, Afraid, Rape, and Kick questionnaire    Fear of Current or Ex-Partner: No    Emotionally Abused: No    Physically Abused: No    Sexually Abused: No     OBSERVATIONS/OBJECTIVE:  BP 131/85 (BP Location: Left Arm, Patient Position: Sitting)   Pulse 88   Temp 97.9 F (36.6 C) (Tympanic)   Resp 18   Ht 5\' 6"  (1.676 m)   Wt 178 lb 12.8 oz (81.1 kg)   LMP  (LMP Unknown)   SpO2 97%   BMI 28.86 kg/m  Patient in shoulder brace due to recent surgery, exam deferred   LABORATORY DATA:  None for this visit.  DIAGNOSTIC IMAGING:  None for this visit.      ASSESSMENT AND PLAN:  Ms.. Howell is a pleasant 67 y.o. female with Stage IA left breast invasive ductal carcinoma, ER+/PR+/HER2-, diagnosed in in January 2024, treated with lumpectomy, adjuvant radiation therapy, and anti-estrogen therapy with anastrozole beginning in April 2024.  She presents to the Survivorship Clinic for our initial meeting and routine  follow-up post-completion of treatment for breast cancer.     1. Stage 1A left breast cancer:  Michelle Howell is continuing to recover from definitive treatment for breast cancer. She will follow-up with her medical oncologist, Dr. Pamelia Hoit in 6 months with history and physical exam per surveillance protocol.  She will continue her anti-estrogen therapy with anastrozole. Thus far, she is tolerating the anastrozole well, with minimal side effects. Her mammogram is due 12/2022; orders placed today.   Today, a comprehensive survivorship care plan and treatment summary was reviewed with the patient today detailing her breast cancer diagnosis, treatment course, potential late/long-term effects of treatment, appropriate follow-up care with recommendations for the future, and patient education resources.  A copy of this summary, along with a letter will be sent to the patient's primary care provider via mail/fax/In Basket message after today's visit.    2.  Lung nodule on previous CT scan: On a CT scan in May 2024 of her shoulder she had a lung nodule that was 3 mm in size and follow-up CT scan in 1 year was recommended-due to her history of breast cancer.  I placed orders for this to be completed in May 2025.  3. Bone health:  Given Michelle Howell's age/history of breast cancer and her current treatment regimen including anti-estrogen therapy with anastrozole, she is at risk for bone demineralization.  Her last DEXA scan was on June 23, 2022.  This was normal.  Despite how good her bones are we still recommend repeating her bone density testing in June 2026 to ensure stability. She was given education on specific activities to promote bone health.  4. Cancer screening:  Due to Michelle Howell's history and her age, she should receive screening for skin cancers, colon cancer, and gynecologic cancers.  The information and recommendations are listed on the patient's comprehensive care plan/treatment summary and were reviewed in detail with the patient.    5. Health maintenance and  wellness promotion: Michelle Howell was encouraged to consume 5-7 servings of fruits and vegetables per day. We reviewed the "Nutrition Rainbow" handout.  She was also encouraged to engage in moderate to vigorous exercise for 30 minutes per day most days of the week.  She was instructed to limit her alcohol consumption and continue to abstain from tobacco use.     6. Support services/counseling: It is not uncommon for this period of the patient's cancer care trajectory to be one of many emotions and stressors.   She was given information regarding our available services and encouraged to contact me with any questions or for help enrolling in any of our support group/programs.    Follow up instructions:    -Return to cancer center in 6 months for f/u with Dr. Pamelia Hoit  -Mammogram due in 01/2022 -CT chest 05/2023 -Bone density testing 06/2024 -She is welcome to return back to the Survivorship Clinic at any time; no additional follow-up needed at this time.  -Consider referral back to survivorship as a long-term survivor for continued surveillance  The patient was provided an opportunity to ask questions and all were answered. The patient agreed with the plan and demonstrated an understanding of the instructions.   Total encounter time:45 minutes*in face-to-face visit time, chart review, lab review, care coordination, order entry, and documentation of the encounter time.    Lillard Anes, NP 08/01/22 3:15 PM Medical Oncology and Hematology [facility] [address] [place], [place] [address] Tel. [phone]    Fax. [phone]  *Total  Encounter Time as defined by the Centers for Medicare and Medicaid Services includes, in addition to the face-to-face time of a patient visit (documented in the note above) non-face-to-face time: obtaining and reviewing outside history, ordering and reviewing medications, tests or procedures, care coordination (communications with other health care  professionals or caregivers) and documentation in the medical record.

## 2022-08-01 NOTE — Telephone Encounter (Signed)
Scheduled appointment per 7/26 los. Patient is aware of the made appointment.

## 2022-08-05 DIAGNOSIS — M19011 Primary osteoarthritis, right shoulder: Secondary | ICD-10-CM | POA: Diagnosis not present

## 2022-08-05 DIAGNOSIS — M25611 Stiffness of right shoulder, not elsewhere classified: Secondary | ICD-10-CM | POA: Diagnosis not present

## 2022-08-05 DIAGNOSIS — M6281 Muscle weakness (generalized): Secondary | ICD-10-CM | POA: Diagnosis not present

## 2022-08-06 ENCOUNTER — Encounter (INDEPENDENT_AMBULATORY_CARE_PROVIDER_SITE_OTHER): Payer: Self-pay

## 2022-08-08 DIAGNOSIS — M6281 Muscle weakness (generalized): Secondary | ICD-10-CM | POA: Diagnosis not present

## 2022-08-08 DIAGNOSIS — M25611 Stiffness of right shoulder, not elsewhere classified: Secondary | ICD-10-CM | POA: Diagnosis not present

## 2022-08-08 DIAGNOSIS — M19011 Primary osteoarthritis, right shoulder: Secondary | ICD-10-CM | POA: Diagnosis not present

## 2022-08-12 DIAGNOSIS — M6281 Muscle weakness (generalized): Secondary | ICD-10-CM | POA: Diagnosis not present

## 2022-08-12 DIAGNOSIS — M19011 Primary osteoarthritis, right shoulder: Secondary | ICD-10-CM | POA: Diagnosis not present

## 2022-08-12 DIAGNOSIS — M25611 Stiffness of right shoulder, not elsewhere classified: Secondary | ICD-10-CM | POA: Diagnosis not present

## 2022-08-15 DIAGNOSIS — M19011 Primary osteoarthritis, right shoulder: Secondary | ICD-10-CM | POA: Diagnosis not present

## 2022-08-15 DIAGNOSIS — M6281 Muscle weakness (generalized): Secondary | ICD-10-CM | POA: Diagnosis not present

## 2022-08-15 DIAGNOSIS — M25611 Stiffness of right shoulder, not elsewhere classified: Secondary | ICD-10-CM | POA: Diagnosis not present

## 2022-08-19 DIAGNOSIS — M25611 Stiffness of right shoulder, not elsewhere classified: Secondary | ICD-10-CM | POA: Diagnosis not present

## 2022-08-19 DIAGNOSIS — M6281 Muscle weakness (generalized): Secondary | ICD-10-CM | POA: Diagnosis not present

## 2022-08-19 DIAGNOSIS — M19011 Primary osteoarthritis, right shoulder: Secondary | ICD-10-CM | POA: Diagnosis not present

## 2022-08-21 ENCOUNTER — Encounter (INDEPENDENT_AMBULATORY_CARE_PROVIDER_SITE_OTHER): Payer: Self-pay

## 2022-08-21 DIAGNOSIS — M19011 Primary osteoarthritis, right shoulder: Secondary | ICD-10-CM | POA: Diagnosis not present

## 2022-08-21 DIAGNOSIS — M25611 Stiffness of right shoulder, not elsewhere classified: Secondary | ICD-10-CM | POA: Diagnosis not present

## 2022-08-21 DIAGNOSIS — M6281 Muscle weakness (generalized): Secondary | ICD-10-CM | POA: Diagnosis not present

## 2022-08-22 ENCOUNTER — Ambulatory Visit: Payer: PPO | Admitting: Family Medicine

## 2022-08-26 DIAGNOSIS — M6281 Muscle weakness (generalized): Secondary | ICD-10-CM | POA: Diagnosis not present

## 2022-08-26 DIAGNOSIS — M25611 Stiffness of right shoulder, not elsewhere classified: Secondary | ICD-10-CM | POA: Diagnosis not present

## 2022-08-26 DIAGNOSIS — M19011 Primary osteoarthritis, right shoulder: Secondary | ICD-10-CM | POA: Diagnosis not present

## 2022-08-29 DIAGNOSIS — M25611 Stiffness of right shoulder, not elsewhere classified: Secondary | ICD-10-CM | POA: Diagnosis not present

## 2022-08-29 DIAGNOSIS — M19011 Primary osteoarthritis, right shoulder: Secondary | ICD-10-CM | POA: Diagnosis not present

## 2022-08-29 DIAGNOSIS — M6281 Muscle weakness (generalized): Secondary | ICD-10-CM | POA: Diagnosis not present

## 2022-09-02 DIAGNOSIS — M25611 Stiffness of right shoulder, not elsewhere classified: Secondary | ICD-10-CM | POA: Diagnosis not present

## 2022-09-02 DIAGNOSIS — M6281 Muscle weakness (generalized): Secondary | ICD-10-CM | POA: Diagnosis not present

## 2022-09-02 DIAGNOSIS — M19011 Primary osteoarthritis, right shoulder: Secondary | ICD-10-CM | POA: Diagnosis not present

## 2022-09-05 DIAGNOSIS — M19011 Primary osteoarthritis, right shoulder: Secondary | ICD-10-CM | POA: Diagnosis not present

## 2022-09-05 DIAGNOSIS — M25611 Stiffness of right shoulder, not elsewhere classified: Secondary | ICD-10-CM | POA: Diagnosis not present

## 2022-09-05 DIAGNOSIS — M6281 Muscle weakness (generalized): Secondary | ICD-10-CM | POA: Diagnosis not present

## 2022-09-09 DIAGNOSIS — M19011 Primary osteoarthritis, right shoulder: Secondary | ICD-10-CM | POA: Diagnosis not present

## 2022-09-09 DIAGNOSIS — M6281 Muscle weakness (generalized): Secondary | ICD-10-CM | POA: Diagnosis not present

## 2022-09-09 DIAGNOSIS — M25611 Stiffness of right shoulder, not elsewhere classified: Secondary | ICD-10-CM | POA: Diagnosis not present

## 2022-09-12 DIAGNOSIS — M19011 Primary osteoarthritis, right shoulder: Secondary | ICD-10-CM | POA: Diagnosis not present

## 2022-09-12 DIAGNOSIS — M25611 Stiffness of right shoulder, not elsewhere classified: Secondary | ICD-10-CM | POA: Diagnosis not present

## 2022-09-12 DIAGNOSIS — M6281 Muscle weakness (generalized): Secondary | ICD-10-CM | POA: Diagnosis not present

## 2022-09-16 DIAGNOSIS — M25611 Stiffness of right shoulder, not elsewhere classified: Secondary | ICD-10-CM | POA: Diagnosis not present

## 2022-09-16 DIAGNOSIS — M6281 Muscle weakness (generalized): Secondary | ICD-10-CM | POA: Diagnosis not present

## 2022-09-16 DIAGNOSIS — M19011 Primary osteoarthritis, right shoulder: Secondary | ICD-10-CM | POA: Diagnosis not present

## 2022-09-19 ENCOUNTER — Ambulatory Visit (INDEPENDENT_AMBULATORY_CARE_PROVIDER_SITE_OTHER): Payer: PPO | Admitting: Family Medicine

## 2022-09-19 ENCOUNTER — Encounter: Payer: Self-pay | Admitting: Family Medicine

## 2022-09-19 VITALS — BP 132/80 | HR 68 | Temp 97.1°F | Ht 66.0 in | Wt 184.4 lb

## 2022-09-19 DIAGNOSIS — I700 Atherosclerosis of aorta: Secondary | ICD-10-CM | POA: Diagnosis not present

## 2022-09-19 DIAGNOSIS — E785 Hyperlipidemia, unspecified: Secondary | ICD-10-CM

## 2022-09-19 DIAGNOSIS — M19011 Primary osteoarthritis, right shoulder: Secondary | ICD-10-CM | POA: Diagnosis not present

## 2022-09-19 DIAGNOSIS — M25611 Stiffness of right shoulder, not elsewhere classified: Secondary | ICD-10-CM | POA: Diagnosis not present

## 2022-09-19 DIAGNOSIS — M6281 Muscle weakness (generalized): Secondary | ICD-10-CM | POA: Diagnosis not present

## 2022-09-19 DIAGNOSIS — I10 Essential (primary) hypertension: Secondary | ICD-10-CM

## 2022-09-19 MED ORDER — HYDROCHLOROTHIAZIDE 25 MG PO TABS: 25.0000 mg | ORAL_TABLET | Freq: Every day | ORAL | 11 refills | Status: DC

## 2022-09-19 NOTE — Progress Notes (Signed)
Phone [phone] In person visit   Subjective:   Michelle Howell is a 67 y.o. year old very pleasant female patient who presents for/with See problem oriented charting Chief Complaint  Patient presents with   Medical Management of Chronic Issues   Hypertension   bruising    Pt c/o bruising easy   Past Medical History-  Patient Active Problem List   Diagnosis Date Noted   Malignant neoplasm of upper-outer quadrant of left breast in female, estrogen receptor positive (HCC) 01/21/2022    Priority: High   Aortic atherosclerosis (HCC) 07/13/2020    Priority: Medium    Essential hypertension 11/24/2017    Priority: Medium    Hyperlipidemia 08/12/2013    Priority: Medium    Genetic testing 02/03/2022    Priority: Low   GERD (gastroesophageal reflux disease) 10/10/2017    Priority: Low   Arthritis of left ankle 08/12/2013    Priority: Low   Gamekeeper's thumb, left 03/06/2022    Medications- reviewed and updated Current Outpatient Medications  Medication Sig Dispense Refill   anastrozole (ARIMIDEX) 1 MG tablet Take 1 tablet (1 mg total) by mouth daily. 90 tablet 3   Ascorbic Acid (VITAMIN C) 1000 MG tablet Take 1,000 mg by mouth daily.     buPROPion (WELLBUTRIN XL) 300 MG 24 hr tablet TAKE 1 TABLET BY MOUTH DAILY 90 tablet 0   Cholecalciferol (VITAMIN D) 2000 UNITS tablet Take 2,000 Units by mouth daily.     Coenzyme Q10 (CO Q 10 PO) Take by mouth daily.     Ginseng 100 MG CAPS Take 100 capsules by mouth daily.     hydrochlorothiazide (HYDRODIURIL) 25 MG tablet Take 1 tablet (25 mg total) by mouth daily. 30 tablet 11   MAGNESIUM PO Take by mouth.     Misc Natural Products (YUMVS BEET ROOT-TART CHERRY PO) Take by mouth.     Omega 3-6-9 Fatty Acids (OMEGA-3-6-9 PO) Take by mouth daily.     PINE BARK, PYCNOGENOL, PO Take by mouth daily.     potassium chloride (MICRO-K) 10 MEQ CR capsule Take 10 mEq by mouth 2 (two) times daily.     Red Yeast Rice Extract (RED YEAST RICE PO)  Take by mouth daily.     rosuvastatin (CRESTOR) 10 MG tablet Take 1 tablet (10 mg total) by mouth daily. 90 tablet 3   No current facility-administered medications for this visit.     Objective:  BP 132/80   Pulse 68   Temp (!) 97.1 F (36.2 C)   Ht 5\' 6"  (1.676 m)   Wt 184 lb 6.4 oz (83.6 kg)   LMP  (LMP Unknown)   SpO2 99%   BMI 29.76 kg/m  Gen: NAD, resting comfortably CV: RRR no murmurs rubs or gallops Lungs: CTAB no crackles, wheeze, rhonchi Ext: trace to 1+ edema Skin: warm, dry     Assessment and Plan   #Shoulder surgery with Dr. Everardo Pacific- went well. Working less this year with   #Easy bruising- mainly forearms- barely hits something and it bruises and can get some substantial bruises. By the time one fades gets another one. Can also happen on labs. Does take omega 3-6-9 but was off of this when it started and later restarted. Did start Celebrex in July but issues started before that -her biggest question was arimedex contributing but I do not see strong info on this connection  #hypertension S: medication: Amlodipine 5 mg  with some edema(started 2.5 mg May 24, 2020.)  but some worsening edema this visit and bothersome  BP Readings from Last 3 Encounters:  09/19/22 132/80  08/01/22 131/85  05/19/22 (!) 150/86  A/P: blood pressure at goal but swelling in legs not tolerable- we opted to stop amlodipine and start hydrochlorothiazide 25 mg and recheck at next visit  #hyperlipidemia with coronary artery calcium score greater than 80 at 81st percentile for age #Aortic atherosclerosis S: Medication: Rosuvastatin 5 mg--> 10 mg daily (up in November 2023) plus red yeast rice Lab Results  Component Value Date   CHOL 192 11/22/2021   HDL 78 11/22/2021   LDLCALC 97 11/22/2021   TRIG 79 11/22/2021   CHOLHDL 2.5 11/22/2021  A/P: hopefully improving- update lipid panel at next visit- continue current medications as suspect improved- hoping LDL under 70 with aortic  atherosclerosis   #appetite management- wellbutrin 300 mg XR has been helpful  for maintaining weigh tin overweight range - feels getting activity up will help when able -doesn't like water, may ride recumbent bike, doesn't love walking Wt Readings from Last 3 Encounters:  09/19/22 184 lb 6.4 oz (83.6 kg)  08/01/22 178 lb 12.8 oz (81.1 kg)  05/19/22 177 lb 3.2 oz (80.4 kg)   Recommended follow up: Return in about 4 months (around 01/19/2023) for physical or sooner if needed.Schedule b4 you leave. Future Appointments  Date Time Provider Department Center  12/15/2022 11:20 AM [facility] DIAG T[facility] 1 [facility]  02/02/2023  9:30 AM Serena Croissant, MD [facility] None  03/23/2023 10:15 AM [facility] ANNUAL WELLNESS VISIT 1 [facility] P[facility]   Lab/Order associations:   ICD-10-CM   1. Essential hypertension  I10     2. Hyperlipidemia, unspecified hyperlipidemia type  E78.5     3. Aortic atherosclerosis (HCC)  I70.0       Meds ordered this encounter  Medications   hydrochlorothiazide (HYDRODIURIL) 25 MG tablet    Sig: Take 1 tablet (25 mg total) by mouth daily.    Dispense:  30 tablet    Refill:  11   Return precautions advised.  Tana Conch, MD

## 2022-09-19 NOTE — Patient Instructions (Addendum)
Health Maintenance Due  Topic Date Due   MAMMOGRAM  03/24/2022  Had one last December- team will add in    blood pressure at goal but swelling in legs not tolerable- we opted to stop amlodipine and start hydrochlorothiazide 25 mg and recheck at next visit  Recommended follow up: Return in about 4 months (around 01/19/2023) for physical or sooner if needed.Schedule b4 you leave.

## 2022-09-23 DIAGNOSIS — M25611 Stiffness of right shoulder, not elsewhere classified: Secondary | ICD-10-CM | POA: Diagnosis not present

## 2022-09-23 DIAGNOSIS — M19011 Primary osteoarthritis, right shoulder: Secondary | ICD-10-CM | POA: Diagnosis not present

## 2022-09-23 DIAGNOSIS — M6281 Muscle weakness (generalized): Secondary | ICD-10-CM | POA: Diagnosis not present

## 2022-09-26 DIAGNOSIS — M25611 Stiffness of right shoulder, not elsewhere classified: Secondary | ICD-10-CM | POA: Diagnosis not present

## 2022-09-26 DIAGNOSIS — M19011 Primary osteoarthritis, right shoulder: Secondary | ICD-10-CM | POA: Diagnosis not present

## 2022-09-26 DIAGNOSIS — M6281 Muscle weakness (generalized): Secondary | ICD-10-CM | POA: Diagnosis not present

## 2022-09-30 DIAGNOSIS — M25611 Stiffness of right shoulder, not elsewhere classified: Secondary | ICD-10-CM | POA: Diagnosis not present

## 2022-09-30 DIAGNOSIS — M19011 Primary osteoarthritis, right shoulder: Secondary | ICD-10-CM | POA: Diagnosis not present

## 2022-09-30 DIAGNOSIS — M6281 Muscle weakness (generalized): Secondary | ICD-10-CM | POA: Diagnosis not present

## 2022-10-02 ENCOUNTER — Other Ambulatory Visit: Payer: Self-pay | Admitting: Family Medicine

## 2022-10-07 DIAGNOSIS — M19011 Primary osteoarthritis, right shoulder: Secondary | ICD-10-CM | POA: Diagnosis not present

## 2022-10-07 DIAGNOSIS — M6281 Muscle weakness (generalized): Secondary | ICD-10-CM | POA: Diagnosis not present

## 2022-10-07 DIAGNOSIS — M25611 Stiffness of right shoulder, not elsewhere classified: Secondary | ICD-10-CM | POA: Diagnosis not present

## 2022-10-09 DIAGNOSIS — M19011 Primary osteoarthritis, right shoulder: Secondary | ICD-10-CM | POA: Diagnosis not present

## 2022-10-10 DIAGNOSIS — M19011 Primary osteoarthritis, right shoulder: Secondary | ICD-10-CM | POA: Diagnosis not present

## 2022-10-10 DIAGNOSIS — M25611 Stiffness of right shoulder, not elsewhere classified: Secondary | ICD-10-CM | POA: Diagnosis not present

## 2022-10-10 DIAGNOSIS — M6281 Muscle weakness (generalized): Secondary | ICD-10-CM | POA: Diagnosis not present

## 2022-10-14 DIAGNOSIS — M19011 Primary osteoarthritis, right shoulder: Secondary | ICD-10-CM | POA: Diagnosis not present

## 2022-10-14 DIAGNOSIS — M6281 Muscle weakness (generalized): Secondary | ICD-10-CM | POA: Diagnosis not present

## 2022-10-14 DIAGNOSIS — M25611 Stiffness of right shoulder, not elsewhere classified: Secondary | ICD-10-CM | POA: Diagnosis not present

## 2022-10-16 DIAGNOSIS — M19011 Primary osteoarthritis, right shoulder: Secondary | ICD-10-CM | POA: Diagnosis not present

## 2022-10-16 DIAGNOSIS — M25611 Stiffness of right shoulder, not elsewhere classified: Secondary | ICD-10-CM | POA: Diagnosis not present

## 2022-10-16 DIAGNOSIS — M6281 Muscle weakness (generalized): Secondary | ICD-10-CM | POA: Diagnosis not present

## 2022-10-21 DIAGNOSIS — M19011 Primary osteoarthritis, right shoulder: Secondary | ICD-10-CM | POA: Diagnosis not present

## 2022-10-21 DIAGNOSIS — M6281 Muscle weakness (generalized): Secondary | ICD-10-CM | POA: Diagnosis not present

## 2022-10-21 DIAGNOSIS — M25611 Stiffness of right shoulder, not elsewhere classified: Secondary | ICD-10-CM | POA: Diagnosis not present

## 2022-10-24 DIAGNOSIS — M6281 Muscle weakness (generalized): Secondary | ICD-10-CM | POA: Diagnosis not present

## 2022-10-24 DIAGNOSIS — M25611 Stiffness of right shoulder, not elsewhere classified: Secondary | ICD-10-CM | POA: Diagnosis not present

## 2022-10-24 DIAGNOSIS — M19011 Primary osteoarthritis, right shoulder: Secondary | ICD-10-CM | POA: Diagnosis not present

## 2022-11-26 ENCOUNTER — Other Ambulatory Visit: Payer: Self-pay | Admitting: Family Medicine

## 2022-12-01 ENCOUNTER — Ambulatory Visit: Payer: PPO | Admitting: Family Medicine

## 2022-12-15 ENCOUNTER — Ambulatory Visit
Admission: RE | Admit: 2022-12-15 | Discharge: 2022-12-15 | Disposition: A | Discharge status: Home / Self Care | Payer: PPO | Source: Ambulatory Visit | Attending: Adult Health | Admitting: Adult Health

## 2022-12-15 DIAGNOSIS — N6320 Unspecified lump in the left breast, unspecified quadrant: Secondary | ICD-10-CM | POA: Diagnosis not present

## 2022-12-15 DIAGNOSIS — Z170 Estrogen receptor positive status [ER+]: Secondary | ICD-10-CM

## 2022-12-15 DIAGNOSIS — Z9889 Other specified postprocedural states: Secondary | ICD-10-CM | POA: Diagnosis not present

## 2022-12-15 HISTORY — DX: Personal history of irradiation: Z92.3

## 2022-12-16 ENCOUNTER — Other Ambulatory Visit: Payer: Self-pay | Admitting: Family Medicine

## 2022-12-16 DIAGNOSIS — E785 Hyperlipidemia, unspecified: Secondary | ICD-10-CM

## 2022-12-22 DIAGNOSIS — M79645 Pain in left finger(s): Secondary | ICD-10-CM | POA: Diagnosis not present

## 2022-12-22 DIAGNOSIS — M79644 Pain in right finger(s): Secondary | ICD-10-CM | POA: Diagnosis not present

## 2022-12-22 DIAGNOSIS — M180 Bilateral primary osteoarthritis of first carpometacarpal joints: Secondary | ICD-10-CM | POA: Diagnosis not present

## 2023-01-03 ENCOUNTER — Other Ambulatory Visit: Payer: Self-pay | Admitting: Family Medicine

## 2023-01-09 DIAGNOSIS — M79645 Pain in left finger(s): Secondary | ICD-10-CM | POA: Diagnosis not present

## 2023-01-16 ENCOUNTER — Ambulatory Visit: Payer: PPO | Admitting: Family Medicine

## 2023-01-19 ENCOUNTER — Ambulatory Visit: Payer: PPO | Admitting: Family Medicine

## 2023-02-02 ENCOUNTER — Inpatient Hospital Stay: Payer: PPO | Attending: Hematology and Oncology | Admitting: Hematology and Oncology

## 2023-02-02 VITALS — BP 141/88 | HR 85 | Temp 97.6°F | Resp 18 | Ht 66.0 in | Wt 187.1 lb

## 2023-02-02 DIAGNOSIS — Z79811 Long term (current) use of aromatase inhibitors: Secondary | ICD-10-CM | POA: Diagnosis not present

## 2023-02-02 DIAGNOSIS — Z1732 Human epidermal growth factor receptor 2 negative status: Secondary | ICD-10-CM | POA: Insufficient documentation

## 2023-02-02 DIAGNOSIS — Z1721 Progesterone receptor positive status: Secondary | ICD-10-CM | POA: Insufficient documentation

## 2023-02-02 DIAGNOSIS — C50412 Malignant neoplasm of upper-outer quadrant of left female breast: Secondary | ICD-10-CM | POA: Diagnosis not present

## 2023-02-02 DIAGNOSIS — Z170 Estrogen receptor positive status [ER+]: Secondary | ICD-10-CM | POA: Diagnosis not present

## 2023-02-02 DIAGNOSIS — Z923 Personal history of irradiation: Secondary | ICD-10-CM | POA: Insufficient documentation

## 2023-02-02 MED ORDER — CELECOXIB 200 MG PO CAPS: 200.0000 mg | ORAL_CAPSULE | Freq: Every day | ORAL | Status: DC

## 2023-02-02 NOTE — Assessment & Plan Note (Signed)
02/10/22: Left lumpectomy: Grade 1 IDC 1 cm with low-grade DCIS, margins negative, ER 100%, PR 90%, HER2 negative, Ki-67 less than 1%, no lymph nodes evaluated.  03/17/2022: Oncotype DX score 19: 6% risk of distant recurrence Adjuvant radiation completed 04/22/2022   Treatment plan: Adjuvant antiestrogen therapy with anastrozole 1 mg daily x 5 to 7 years   Anastrozole toxicities:  Breast cancer surveillance: Breast exam 02/02/2023: Benign Mammogram 12/15/2022: Benign breast density category C  Return to clinic in 1 year for follow-up

## 2023-02-02 NOTE — Progress Notes (Signed)
Patient Care Team: Michelle Majestic, MD as PCP - General (Family Medicine) Michelle Croissant, MD as Consulting Physician (Hematology and Oncology) Michelle Loron, MD as Consulting Physician (General Surgery) Michelle Blackbird, MD as Consulting Physician (Radiation Oncology)  DIAGNOSIS:  Encounter Diagnosis  Name Primary?   Malignant neoplasm of upper-outer quadrant of left breast in female, estrogen receptor positive (HCC) Yes    SUMMARY OF ONCOLOGIC HISTORY: Oncology History  Malignant neoplasm of upper-outer quadrant of left breast in female, estrogen receptor positive (HCC)  01/10/2022 Initial Diagnosis   Screening mammogram detected left breast distortion and mass polyp-like position measuring 0.9 cm, 1 abnormal lymph node biopsy: Benign concordant, breast biopsy: Grade 1 IDC ER 100%, PR 90%, Ki-67 < 1%, HER2 2+ by IHC FISH negative   01/22/2022 Cancer Staging   Staging form: Breast, AJCC 8th Edition - Clinical: Stage IA (cT1b, cN0, cM0, G1, ER+, PR+, HER2-) - Signed by Michelle Croissant, MD on 01/22/2022 Stage prefix: Initial diagnosis Histologic grading system: 3 grade system   02/03/2022 Genetic Testing   Negative [facility] Multi-Cancer +RNA Panel.  Report date is 02/03/2022.   The Multi-Cancer + RNA Panel offered by [facility] includes sequencing and/or deletion/duplication analysis of the following 70 genes:  AIP*, ALK, APC*, ATM*, AXIN2*, BAP1*, BARD1*, BLM*, BMPR1A*, BRCA1*, BRCA2*, BRIP1*, CDC73*, CDH1*, CDK4, CDKN1B*, CDKN2A, CHEK2*, CTNNA1*, DICER1*, EPCAM (del/dup only), EGFR, FH*, FLCN*, GREM1 (promoter dup only), HOXB13, KIT, LZTR1, MAX*, MBD4, MEN1*, MET, MITF, MLH1*, MSH2*, MSH3*, MSH6*, MUTYH*, NF1*, NF2*, NTHL1*, PALB2*, PDGFRA, PMS2*, POLD1*, POLE*, POT1*, PRKAR1A*, PTCH1*, PTEN*, RAD51C*, RAD51D*, RB1*, RET, SDHA* (sequencing only), SDHAF2*, SDHB*, SDHC*, SDHD*, SMAD4*, SMARCA4*, SMARCB1*, SMARCE1*, STK11*, SUFU*, TMEM127*, TP53*, TSC1*, TSC2*, VHL*. RNA analysis is performed for  * genes.   02/10/2022 Surgery   Left lumpectomy: Grade 1 IDC 1 cm with low-grade DCIS, margins negative, ER 100%, PR 90%, HER2 negative, Ki-67 less than 1%, no lymph nodes evaluated   03/17/2022 Oncotype testing   Oncotype DX recurrence score 19: Distant recurrence at 9 years: 6%   04/01/2022 - 04/22/2022 Radiation Therapy   Plan Name: Breast_L_BH Site: Breast, Left Technique: 3D Mode: Photon Dose Per Fraction: 2.67 Gy Prescribed Dose (Delivered / Prescribed): 42.72 Gy / 42.72 Gy Prescribed Fxs (Delivered / Prescribed): 16 / 16   04/2022 -  Anti-estrogen oral therapy   Anastrozole daily     CHIEF COMPLIANT: Follow-up on anastrozole  HISTORY OF PRESENT ILLNESS:   History of Present Illness   The patient, on anastrozole for eight months, reports thinning hair which has since improved. She also notes new bruising that takes a long time to resolve. The patient denies any changes in her blood pressure medication regimen, which includes hydrochlorothiazide, and has been on Celebrex. She also reports a recent shoulder replacement surgery, which required three and a half months of rehabilitation. The patient has been managing well post-operatively, despite some residual stiffness. The patient's mammogram in December was unremarkable, despite dense breast tissue. The patient is scheduled for annual follow-ups and medication renewals as needed.         ALLERGIES:  is allergic to meperidine hcl and penicillins.  MEDICATIONS:  Current Outpatient Medications  Medication Sig Dispense Refill   celecoxib (CELEBREX) 200 MG capsule Take 1 capsule (200 mg total) by mouth daily.     anastrozole (ARIMIDEX) 1 MG tablet Take 1 tablet (1 mg total) by mouth daily. 90 tablet 3   Ascorbic Acid (VITAMIN C) 1000 MG tablet Take 1,000 mg by mouth daily.  buPROPion (WELLBUTRIN XL) 300 MG 24 hr tablet TAKE 1 TABLET BY MOUTH DAILY 90 tablet 0   Cholecalciferol (VITAMIN D) 2000 UNITS tablet Take 2,000 Units by  mouth daily.     Coenzyme Q10 (CO Q 10 PO) Take by mouth daily.     Ginseng 100 MG CAPS Take 1 capsule by mouth daily.     hydrochlorothiazide (HYDRODIURIL) 25 MG tablet Take 1 tablet (25 mg total) by mouth daily. 30 tablet 11   MAGNESIUM PO Take by mouth.     Misc Natural Products (YUMVS BEET ROOT-TART CHERRY PO) Take by mouth.     Omega 3-6-9 Fatty Acids (OMEGA-3-6-9 PO) Take by mouth daily.     PINE BARK, PYCNOGENOL, PO Take by mouth daily.     potassium chloride (MICRO-K) 10 MEQ CR capsule Take 10 mEq by mouth daily.     Red Yeast Rice Extract (RED YEAST RICE PO) Take by mouth daily.     rosuvastatin (CRESTOR) 10 MG tablet TAKE 1 TABLET BY MOUTH DAILY 90 tablet 3   No current facility-administered medications for this visit.    PHYSICAL EXAMINATION: ECOG PERFORMANCE STATUS: 1 - Symptomatic but completely ambulatory  Vitals:   02/02/23 0940  BP: (!) 141/88  Pulse: 85  Resp: 18  Temp: 97.6 F (36.4 C)  SpO2: 97%   Filed Weights   02/02/23 0940  Weight: 187 lb 1.6 oz (84.9 kg)    Physical Exam   BREAST: Examination under the arms unremarkable. MUSCULOSKELETAL: Right shoulder with stiffness.      (exam performed in the presence of a chaperone)  LABORATORY DATA:  I have reviewed the data as listed    Latest Ref Rng & Units 01/22/2022    8:30 AM 11/22/2021    3:50 PM 05/24/2020    4:22 PM  CMP  Glucose 70 - 99 mg/dL 95  80  80   BUN 8 - 23 mg/dL 15  16  14    Creatinine 0.44 - 1.00 mg/dL 1.61  0.96  0.45   Sodium 135 - 145 mmol/L 138  138  138   Potassium 3.5 - 5.1 mmol/L 4.1  3.9  4.5   Chloride 98 - 111 mmol/L 104  102  98   CO2 22 - 32 mmol/L 29  25  23    Calcium 8.9 - 10.3 mg/dL 40.9  81.1  9.8   Total Protein 6.5 - 8.1 g/dL 6.6  7.1  7.3   Total Bilirubin 0.3 - 1.2 mg/dL 0.8  0.5  0.4   Alkaline Phos 38 - 126 U/L 63   96   AST 15 - 41 U/L 22  28  24    ALT 0 - 44 U/L 32  32  25     Lab Results  Component Value Date   WBC 4.2 01/22/2022   HGB 13.6  01/22/2022   HCT 39.1 01/22/2022   MCV 91.1 01/22/2022   PLT 267 01/22/2022   NEUTROABS 2.4 01/22/2022    ASSESSMENT & PLAN:  Malignant neoplasm of upper-outer quadrant of left breast in female, estrogen receptor positive (HCC) 02/10/22: Left lumpectomy: Grade 1 IDC 1 cm with low-grade DCIS, margins negative, ER 100%, PR 90%, HER2 negative, Ki-67 less than 1%, no lymph nodes evaluated.  03/17/2022: Oncotype DX score 19: 6% risk of distant recurrence Adjuvant radiation completed 04/22/2022   Treatment plan: Adjuvant antiestrogen therapy with anastrozole 1 mg daily x 5 to 7 years   Anastrozole toxicities: Hair thinning  Breast  cancer surveillance: Breast exam 02/02/2023: Benign Mammogram 12/15/2022: Benign breast density category C  Return to clinic in 1 year for follow-up    No orders of the defined types were placed in this encounter.  The patient has a good understanding of the overall plan. she agrees with it. she will call with any problems that may develop before the next visit here. Total time spent: 30 mins including face to face time and time spent for planning, charting and co-ordination of care   Tamsen Meek, MD 02/02/23

## 2023-03-23 ENCOUNTER — Ambulatory Visit (INDEPENDENT_AMBULATORY_CARE_PROVIDER_SITE_OTHER): Payer: PPO

## 2023-03-23 VITALS — BP 141/88 | Ht 66.0 in | Wt 180.0 lb

## 2023-03-23 DIAGNOSIS — Z0000 Encounter for general adult medical examination without abnormal findings: Secondary | ICD-10-CM | POA: Diagnosis not present

## 2023-03-23 NOTE — Progress Notes (Unsigned)
 Subjective:   Michelle Howell is a 68 y.o. who presents for a Medicare Wellness preventive visit.  Visit Complete: Virtual I connected with  Michelle Howell on 03/23/23 by a audio enabled telemedicine application and verified that I am speaking with the correct person using two identifiers.  Patient Location: Home  Provider Location: Home Office  I discussed the limitations of evaluation and management by telemedicine. The patient expressed understanding and agreed to proceed.  Vital Signs: Because this visit was a virtual/telehealth visit, some criteria may be missing or patient reported. Any vitals not documented were not able to be obtained and vitals that have been documented are patient reported.  VideoDeclined- This patient declined Librarian, academic. Therefore the visit was completed with audio only.  Persons Participating in Visit: Patient.  AWV Questionnaire: Yes: Patient Medicare AWV questionnaire was completed by the patient on 03/22/23; I have confirmed that all information answered by patient is correct and no changes since this date.  Cardiac Risk Factors include: dyslipidemia     Objective:    Today's Vitals   03/23/23 1048 03/23/23 1050  BP: (!) 141/88   Weight: 180 lb (81.6 kg)   Height: 5\' 6"  (1.676 m)   PainSc:  6    Body mass index is 29.05 kg/m.     03/23/2023   10:58 AM 08/01/2022    3:08 PM 05/19/2022    4:11 PM 04/28/2022    3:25 PM 03/24/2022    1:18 PM 03/17/2022   11:37 AM 02/24/2022    2:16 PM  Advanced Directives  Does Patient Have a Medical Advance Directive? Yes Yes Yes Yes Yes Yes Yes  Type of Estate agent of [place];Living will Healthcare Power of [employer] Power of [employer] Power of [place];Living will Healthcare Power of [place];Living will Healthcare Power of Attorney  Does patient want to make changes to medical advance directive?   No - Patient declined No -  Patient declined No - Patient declined  No - Patient declined  Copy of Healthcare Power of Attorney in Chart? No - copy requested   No - copy requested No - copy requested No - copy requested No - copy requested    Current Medications (verified) Outpatient Encounter Medications as of 03/23/2023  Medication Sig   anastrozole (ARIMIDEX) 1 MG tablet Take 1 tablet (1 mg total) by mouth daily.   Ascorbic Acid (VITAMIN C) 1000 MG tablet Take 1,000 mg by mouth daily.   buPROPion (WELLBUTRIN XL) 300 MG 24 hr tablet TAKE 1 TABLET BY MOUTH DAILY   celecoxib (CELEBREX) 200 MG capsule Take 1 capsule (200 mg total) by mouth daily.   Cholecalciferol (VITAMIN D) 2000 UNITS tablet Take 2,000 Units by mouth daily.   Coenzyme Q10 (CO Q 10 PO) Take by mouth daily.   Ginseng 100 MG CAPS Take 1 capsule by mouth daily.   hydrochlorothiazide (HYDRODIURIL) 25 MG tablet Take 1 tablet (25 mg total) by mouth daily.   MAGNESIUM PO Take by mouth.   Misc Natural Products (YUMVS BEET ROOT-TART CHERRY PO) Take by mouth.   Omega 3-6-9 Fatty Acids (OMEGA-3-6-9 PO) Take by mouth daily.   PINE BARK, PYCNOGENOL, PO Take by mouth daily.   potassium chloride (MICRO-K) 10 MEQ CR capsule Take 10 mEq by mouth daily.   Red Yeast Rice Extract (RED YEAST RICE PO) Take by mouth daily.   rosuvastatin (CRESTOR) 10 MG tablet TAKE 1 TABLET BY MOUTH DAILY  No facility-administered encounter medications on file as of 03/23/2023.    Allergies (verified) Meperidine hcl and Penicillins   History: Past Medical History:  Diagnosis Date   GERD (gastroesophageal reflux disease)    History of radiation therapy    Left breast- 04/01/22-04/22/22- Dr. Antony Blackbird   Hypertension    Personal history of radiation therapy    Past Surgical History:  Procedure Laterality Date   BREAST BIOPSY Left 01/10/2022   [place] LT BREAST BX W LOC DEV 1ST LESION IMG BX SPEC US GUIDE 01/10/2022 [facility]   BREAST BIOPSY  02/06/2022   MM LT RADIOACTIVE SEED  LOC MAMMO GUIDE 02/06/2022 [facility]   BREAST LUMPECTOMY WITH RADIOACTIVE SEED LOCALIZATION Left 02/10/2022   Procedure: LEFT BREAST LUMPECTOMY WITH RADIOACTIVE SEED LOCALIZATION;  Surgeon: Emelia Loron, MD;  Location: [facility];  Service: General;  Laterality: Left;   dental implants  1985   KNEE SURGERY  1982   left   SHOULDER SURGERY  1991   right   Family History  Problem Relation Age of Onset   Hypertension Mother    Hyperlipidemia Mother    Heart disease Mother    Heart attack Mother    Heart disease Father    Hypertension Father    Heart failure Father    Heart attack Father    Diabetes Sister    CAD Sister        minor heart attack in her 71s   Kidney disease Sister        required transplant after severe c diff   Diabetes Brother    Kidney cancer Brother        dx early 80s   Heart disease Brother        died at 74    Other Brother        back issues   CAD Brother        age 57   Brain cancer Paternal Aunt        d. after 6   Kidney cancer Cousin        paternal female cousin; dx after 9   Colon cancer Neg Hx    Colon polyps Neg Hx    Esophageal cancer Neg Hx    Rectal cancer Neg Hx    Stomach cancer Neg Hx    Social History   Socioeconomic History   Marital status: Married    Spouse name: Not on file   Number of children: Not on file   Years of education: Not on file   Highest education level: Bachelor's degree (e.g., BA, AB, BS)  Occupational History   Not on file  Tobacco Use   Smoking status: Never   Smokeless tobacco: Never  Vaping Use   Vaping status: Never Used  Substance and Sexual Activity   Alcohol use: Yes    Alcohol/week: 14.0 standard drinks of alcohol    Types: 14 Cans of beer per week   Drug use: No   Sexual activity: Not on file  Other Topics Concern   Not on file  Social History Narrative   Married      Teacher, adult education      Hobbies: tennis   Social Drivers of Health   Financial Resource  Strain: Low Risk  (03/23/2023)   Overall Financial Resource Strain (CARDIA)    Difficulty of Paying Living Expenses: Not hard at all  Food Insecurity: No Food Insecurity (03/23/2023)   Hunger Vital Sign  Worried About Programme researcher, broadcasting/film/video in the Last Year: Never true    Ran Out of Food in the Last Year: Never true  Transportation Needs: No Transportation Needs (03/23/2023)   PRAPARE - Administrator, Civil Service (Medical): No    Lack of Transportation (Non-Medical): No  Physical Activity: Sufficiently Active (03/23/2023)   Exercise Vital Sign    Days of Exercise per Week: 5 days    Minutes of Exercise per Session: 50 min  Stress: No Stress Concern Present (03/23/2023)   [employer] of Occupational Health - Occupational Stress Questionnaire    Feeling of Stress : Not at all  Social Connections: Socially Integrated (03/23/2023)   Social Connection and Isolation Panel [NHANES]    Frequency of Communication with Friends and Family: More than three times a week    Frequency of Social Gatherings with Friends and Family: More than three times a week    Attends Religious Services: More than 4 times per year    Active Member of [employer] or Organizations: Yes    Attends Engineer, structural: More than 4 times per year    Marital Status: Married    Tobacco Counseling Counseling given: Not Answered    Clinical Intake:  Pre-visit preparation completed: Yes  Pain : 0-10 (both thumb-5-7/9 when moving) Pain Score: 6  (varies based on when use per pt) Pain Type: Chronic pain Pain Location:  (both thumbs, hurt due injuries) Pain Orientation: Left, Right Pain Descriptors / Indicators: Aching Pain Onset: Other (comment) (ongoing chronically) Pain Frequency:  (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) Pain Relieving Factors: OTC pains meds.-ibuprofen  Pain Relieving Factors: OTC pains meds.-ibuprofen  BMI - recorded: 29.05 Nutritional Status: BMI 25 -29  Overweight Nutritional Risks: Other (Comment) Diabetes: No  How often do you need to have someone help you when you read instructions, pamphlets, or other written materials from your doctor or pharmacy?: 1 - Never What is the last grade level you completed in school?: bachelors degree     Information entered by :: Smith Mince   Activities of Daily Living     03/22/2023    4:53 PM  In your present state of health, do you have any difficulty performing the following activities:  Hearing? 0  Vision? 0  Difficulty concentrating or making decisions? 0  Walking or climbing stairs? 0  Dressing or bathing? 0  Doing errands, shopping? 0  Preparing Food and eating ? N  Using the Toilet? N  In the past six months, have you accidently leaked urine? N  Do you have problems with loss of bowel control? N  Managing your Medications? N  Managing your Finances? N  Housekeeping or managing your Housekeeping? N    Patient Care Team: Shelva Majestic, MD as PCP - General (Family Medicine) Serena Croissant, MD as Consulting Physician (Hematology and Oncology) Emelia Loron, MD as Consulting Physician (General Surgery) Antony Blackbird, MD as Consulting Physician (Radiation Oncology)  Indicate any recent Medical Services you may have received from other than [facility] providers in the past year (date may be approximate).     Assessment:   This is a routine wellness examination for [facility].  Hearing/Vision screen Hearing Screening - Comments:: Pt denied hearing def Vision Screening - Comments:: Last appt was last year--per pt will find provider and make appt No concerns w/vision has glasses   Goals Addressed             This Visit's Progress  Patient Stated       Improve fitness level       Depression Screen     03/23/2023   11:10 AM 03/23/2023   11:02 AM 09/19/2022    2:26 PM 03/17/2022   11:34 AM 03/06/2022    9:07 AM 05/30/2021    3:17 PM 05/24/2020    4:02 PM  PHQ 2/9  Scores  PHQ - 2 Score 0 0 0 0 0 0 0  PHQ- 9 Score 0 0 0   0 0    Fall Risk     03/23/2023   11:00 AM 03/22/2023    4:53 PM 03/17/2022   11:38 AM 03/06/2022    9:07 AM 11/22/2021    2:37 PM  Fall Risk   Falls in the past year? 1 1 1 1  0  Number falls in past yr: 0 0 1 1 0  Injury with Fall? 1 1 1 1  0  Comment hit chin  left thumb ligament tear    Risk for fall due to : History of fall(s);Impaired balance/gait;Orthopedic patient  Impaired balance/gait;Impaired vision;History of fall(s) No Fall Risks No Fall Risks  Follow up Falls prevention discussed;Falls evaluation completed  Falls prevention discussed  Falls evaluation completed    MEDICARE RISK AT HOME:  Medicare Risk at Home Any stairs in or around the home?: Yes If so, are there any without handrails?: Yes Home free of loose throw rugs in walkways, pet beds, electrical cords, etc?: No Adequate lighting in your home to reduce risk of falls?: Yes Life alert?: No Use of a cane, walker or w/c?: No Grab bars in the bathroom?: No Shower chair or bench in shower?: No Elevated toilet seat or a handicapped toilet?: No  TIMED UP AND GO:  Was the test performed?  No  Cognitive Function: 6CIT completed        03/23/2023   11:05 AM  6CIT Screen  What Year? 0 points  What month? 0 points  What time? 0 points  Count back from 20 0 points  Months in reverse 4 points  Repeat phrase 10 points  Total Score 14 points    Immunizations Immunization History  Administered Date(s) Administered   PFIZER(Purple Top)SARS-COV-2 Vaccination 04/01/2019, 04/22/2019, 10/16/2019   Td 09/02/2007   Tdap 10/08/2017   Unspecified SARS-COV-2 Vaccination 12/13/2021    Screening Tests Health Maintenance  Topic Date Due   Pneumonia Vaccine 5+ Years old (1 of 2 - PCV) Never done   Zoster Vaccines- Shingrix (1 of 2) Never done   INFLUENZA VACCINE  04/06/2023 (Originally 08/07/2022)   COVID-19 Vaccine (5 - 2024-25 season) 03/22/2024 (Originally  09/07/2022)   Medicare Annual Wellness (AWV)  03/22/2024   MAMMOGRAM  12/14/2024   Colonoscopy  12/20/2024   DTaP/Tdap/Td (3 - Td or Tdap) 10/09/2027   DEXA SCAN  Completed   HPV VACCINES  Aged Out   Hepatitis C Screening  Discontinued    Health Maintenance  Health Maintenance Due  Topic Date Due   Pneumonia Vaccine 45+ Years old (1 of 2 - PCV) Never done   Zoster Vaccines- Shingrix (1 of 2) Never done   Health Maintenance Items Addressed: See Nurse Notes  Additional Screening:  Vision Screening: Recommended annual ophthalmology exams for early detection of glaucoma and other disorders of the eye.  Dental Screening: Recommended annual dental exams for proper oral hygiene  Community Resource Referral / Chronic Care Management: CRR required this visit?  No   CCM required this visit?  No     Plan:     I have personally reviewed and noted the following in the patient's chart:   Medical and social history Use of alcohol, tobacco or illicit drugs  Current medications and supplements including opioid prescriptions. Patient is not currently taking opioid prescriptions. Functional ability and status Nutritional status Physical activity Advanced directives List of other physicians Hospitalizations, surgeries, and ER visits in previous 12 months Vitals Screenings to include cognitive, depression, and falls Referrals and appointments  In addition, I have reviewed and discussed with patient certain preventive protocols, quality metrics, and best practice recommendations. A written personalized care plan for preventive services as well as general preventive health recommendations were provided to patient.     Arta Silence, CMA   03/23/2023   After Visit Summary: (MyChart) Due to this being a telephonic visit, the after visit summary with patients personalized plan was offered to patient via MyChart   Notes:  Need to discuss with pt regarding which vaccines are recommended.  Pt is aware that she is due for an eye exam. Per pt will make appt when she is able to . Was unable to finish pt's 6cit due to pt has another appt hence the high score No other concerns

## 2023-03-23 NOTE — Progress Notes (Signed)
 I have reviewed and agree with note, evaluation, plan.  Patient has follow-up with me on the 31st of this month to discuss blood pressure.  I would consider 6 CIT incomplete not elevated based on information provided  Tana Conch, MD

## 2023-03-23 NOTE — Patient Instructions (Signed)
 [name] , Thank you for taking time to come for your Medicare Wellness Visit. I appreciate your ongoing commitment to your health goals. Please review the following plan we discussed and let me know if I can assist you in the future.   Referrals/Orders/Follow-Ups/Clinician Recommendations: Please discuss with your provider regarding your vaccines and which ones he will recommend at your next follow up visit. Also, don't forget to make your Eye appointment as soon as you can.   This is a list of the screening recommended for you and due dates:  Health Maintenance  Topic Date Due   Pneumonia Vaccine (1 of 2 - PCV) Never done   Zoster (Shingles) Vaccine (1 of 2) Never done   Flu Shot  04/06/2023*   COVID-19 Vaccine (5 - 2024-25 season) 03/22/2024*   Medicare Annual Wellness Visit  03/22/2024   Mammogram  12/14/2024   Colon Cancer Screening  12/20/2024   DTaP/Tdap/Td vaccine (3 - Td or Tdap) 10/09/2027   DEXA scan (bone density measurement)  Completed   HPV Vaccine  Aged Out   Hepatitis C Screening  Discontinued  *Topic was postponed. The date shown is not the original due date.    Advanced directives: (Copy Requested) Please bring a copy of your health care power of attorney and living will to the office to be added to your chart at your convenience. You can mail to [facility] [address] [place], [place] [address] or email to [email or web address]  Next Medicare Annual Wellness Visit scheduled for next year: yes

## 2023-03-24 ENCOUNTER — Encounter: Payer: Self-pay | Admitting: Family Medicine

## 2023-03-30 DIAGNOSIS — M65311 Trigger thumb, right thumb: Secondary | ICD-10-CM | POA: Diagnosis not present

## 2023-03-30 DIAGNOSIS — M180 Bilateral primary osteoarthritis of first carpometacarpal joints: Secondary | ICD-10-CM | POA: Diagnosis not present

## 2023-04-06 ENCOUNTER — Ambulatory Visit (INDEPENDENT_AMBULATORY_CARE_PROVIDER_SITE_OTHER): Payer: PPO | Admitting: Family Medicine

## 2023-04-06 ENCOUNTER — Encounter: Payer: Self-pay | Admitting: Family Medicine

## 2023-04-06 VITALS — BP 130/80 | HR 94 | Temp 97.3°F | Ht 66.0 in | Wt 177.8 lb

## 2023-04-06 DIAGNOSIS — R2689 Other abnormalities of gait and mobility: Secondary | ICD-10-CM | POA: Diagnosis not present

## 2023-04-06 DIAGNOSIS — E663 Overweight: Secondary | ICD-10-CM

## 2023-04-06 DIAGNOSIS — Z131 Encounter for screening for diabetes mellitus: Secondary | ICD-10-CM | POA: Diagnosis not present

## 2023-04-06 DIAGNOSIS — E785 Hyperlipidemia, unspecified: Secondary | ICD-10-CM | POA: Diagnosis not present

## 2023-04-06 DIAGNOSIS — I10 Essential (primary) hypertension: Secondary | ICD-10-CM

## 2023-04-06 DIAGNOSIS — Z0000 Encounter for general adult medical examination without abnormal findings: Secondary | ICD-10-CM

## 2023-04-06 DIAGNOSIS — G629 Polyneuropathy, unspecified: Secondary | ICD-10-CM

## 2023-04-06 DIAGNOSIS — I700 Atherosclerosis of aorta: Secondary | ICD-10-CM

## 2023-04-06 LAB — CBC WITH DIFFERENTIAL/PLATELET
Basophils Absolute: 0 10*3/uL (ref 0.0–0.1)
Basophils Relative: 0.6 % (ref 0.0–3.0)
Eosinophils Absolute: 0.1 10*3/uL (ref 0.0–0.7)
Eosinophils Relative: 2 % (ref 0.0–5.0)
HCT: 44 % (ref 36.0–46.0)
Hemoglobin: 15.3 g/dL — ABNORMAL HIGH (ref 12.0–15.0)
Lymphocytes Relative: 25.5 % (ref 12.0–46.0)
Lymphs Abs: 1.3 10*3/uL (ref 0.7–4.0)
MCHC: 34.7 g/dL (ref 30.0–36.0)
MCV: 91 fl (ref 78.0–100.0)
Monocytes Absolute: 0.5 10*3/uL (ref 0.1–1.0)
Monocytes Relative: 10.2 % (ref 3.0–12.0)
Neutro Abs: 3.1 10*3/uL (ref 1.4–7.7)
Neutrophils Relative %: 61.7 % (ref 43.0–77.0)
Platelets: 327 10*3/uL (ref 150.0–400.0)
RBC: 4.84 Mil/uL (ref 3.87–5.11)
RDW: 12.1 % (ref 11.5–15.5)
WBC: 5 10*3/uL (ref 4.0–10.5)

## 2023-04-06 LAB — LIPID PANEL
Cholesterol: 151 mg/dL (ref 0–200)
HDL: 60.8 mg/dL (ref >39.00)
LDL Cholesterol: 63 mg/dL (ref 0–99)
NonHDL: 90.14
Total CHOL/HDL Ratio: 2
Triglycerides: 137 mg/dL (ref 0.0–149.0)
VLDL: 27.4 mg/dL (ref 0.0–40.0)

## 2023-04-06 LAB — COMPREHENSIVE METABOLIC PANEL WITH GFR
ALT: 31 U/L (ref 0–35)
AST: 24 U/L (ref 0–37)
Albumin: 4.7 g/dL (ref 3.5–5.2)
Alkaline Phosphatase: 90 U/L (ref 39–117)
BUN: 15 mg/dL (ref 6–23)
CO2: 27 meq/L (ref 19–32)
Calcium: 9.9 mg/dL (ref 8.4–10.5)
Chloride: 92 meq/L — ABNORMAL LOW (ref 96–112)
Creatinine, Ser: 0.7 mg/dL (ref 0.40–1.20)
GFR: 89.36 mL/min (ref >60.00)
Glucose, Bld: 108 mg/dL — ABNORMAL HIGH (ref 70–99)
Potassium: 3.9 meq/L (ref 3.5–5.1)
Sodium: 129 meq/L — ABNORMAL LOW (ref 135–145)
Total Bilirubin: 0.6 mg/dL (ref 0.2–1.2)
Total Protein: 7.8 g/dL (ref 6.0–8.3)

## 2023-04-06 LAB — HEMOGLOBIN A1C: Hgb A1c MFr Bld: 5.6 % (ref 4.6–6.5)

## 2023-04-06 LAB — VITAMIN B12: Vitamin B-12: 1173 pg/mL — ABNORMAL HIGH (ref 211–911)

## 2023-04-06 NOTE — Progress Notes (Signed)
 Phone [phone]   Subjective:  Patient presents today for their annual physical. Chief complaint-noted.   See problem oriented charting- ROS- full  review of systems was completed and negative Per full ROS sheet completed by patient- other than some stress with current political environment  The following were reviewed and entered/updated in epic: Past Medical History:  Diagnosis Date   Arthritis    GERD (gastroesophageal reflux disease)    History of radiation therapy    Left breast- 04/01/22-04/22/22- Dr. Antony Blackbird   Hypertension    Personal history of radiation therapy    Patient Active Problem List   Diagnosis Date Noted   Malignant neoplasm of upper-outer quadrant of left breast in female, estrogen receptor positive (HCC) 01/21/2022    Priority: High   Aortic atherosclerosis (HCC) 07/13/2020    Priority: Medium    Essential hypertension 11/24/2017    Priority: Medium    Hyperlipidemia 08/12/2013    Priority: Medium    Genetic testing 02/03/2022    Priority: Low   GERD (gastroesophageal reflux disease) 10/10/2017    Priority: Low   Arthritis of left ankle 08/12/2013    Priority: Low   Gamekeeper's thumb, left 03/06/2022   Past Surgical History:  Procedure Laterality Date   BREAST BIOPSY Left 01/10/2022   [place] LT BREAST BX W LOC DEV 1ST LESION IMG BX SPEC US GUIDE 01/10/2022 [facility]   BREAST BIOPSY  02/06/2022   MM LT RADIOACTIVE SEED LOC MAMMO GUIDE 02/06/2022 [facility]   BREAST LUMPECTOMY WITH RADIOACTIVE SEED LOCALIZATION Left 02/10/2022   Procedure: LEFT BREAST LUMPECTOMY WITH RADIOACTIVE SEED LOCALIZATION;  Surgeon: Emelia Loron, MD;  Location: [facility];  Service: General;  Laterality: Left;   dental implants  01/07/1983   JOINT REPLACEMENT     KNEE SURGERY  01/07/1980   left   SHOULDER SURGERY  01/06/1989   right    Family History  Problem Relation Age of Onset   Hypertension Mother    Hyperlipidemia Mother     Heart disease Mother    Heart attack Mother    Heart disease Father    Hypertension Father    Heart failure Father    Heart attack Father    Diabetes Sister    CAD Sister        minor heart attack in her 32s   Kidney disease Sister        required transplant after severe c diff   Diabetes Brother    Kidney cancer Brother        dx early 70s   Heart disease Brother        died at 7    Other Brother        back issues   CAD Brother        age 30   Brain cancer Paternal Aunt        d. after 104   Kidney cancer Cousin        paternal female cousin; dx after 51   Colon cancer Neg Hx    Colon polyps Neg Hx    Esophageal cancer Neg Hx    Rectal cancer Neg Hx    Stomach cancer Neg Hx     Medications- reviewed and updated Current Outpatient Medications  Medication Sig Dispense Refill   anastrozole (ARIMIDEX) 1 MG tablet Take 1 tablet (1 mg total) by mouth daily. 90 tablet 3   Ascorbic Acid (VITAMIN C) 1000 MG tablet Take 1,000 mg by mouth  daily.     buPROPion (WELLBUTRIN XL) 300 MG 24 hr tablet TAKE 1 TABLET BY MOUTH DAILY 90 tablet 0   Cholecalciferol (VITAMIN D) 2000 UNITS tablet Take 2,000 Units by mouth daily.     Coenzyme Q10 (CO Q 10 PO) Take by mouth daily.     Ginseng 100 MG CAPS Take 1 capsule by mouth daily.     hydrochlorothiazide (HYDRODIURIL) 25 MG tablet Take 1 tablet (25 mg total) by mouth daily. 30 tablet 11   MAGNESIUM PO Take by mouth.     Misc Natural Products (YUMVS BEET ROOT-TART CHERRY PO) Take by mouth.     Omega 3-6-9 Fatty Acids (OMEGA-3-6-9 PO) Take by mouth daily.     PINE BARK, PYCNOGENOL, PO Take by mouth daily.     potassium chloride (MICRO-K) 10 MEQ CR capsule Take 10 mEq by mouth daily.     Red Yeast Rice Extract (RED YEAST RICE PO) Take by mouth daily.     rosuvastatin (CRESTOR) 10 MG tablet TAKE 1 TABLET BY MOUTH DAILY 90 tablet 3   No current facility-administered medications for this visit.    Allergies-reviewed and updated Allergies   Allergen Reactions   Meperidine Hcl Nausea And Vomiting   Penicillins     Per pt: unknown    Social History   Social History Narrative   Married      Teacher, adult education      Hobbies: tennis   Objective  Objective:  BP 130/80   Pulse 94   Temp (!) 97.3 F (36.3 C)   Ht 5\' 6"  (1.676 m)   Wt 177 lb 12.8 oz (80.6 kg)   LMP  (LMP Unknown)   SpO2 96%   BMI 28.70 kg/m  Gen: NAD, resting comfortably HEENT: Mucous membranes are moist. Oropharynx normal Neck: no thyromegaly CV: RRR no murmurs rubs or gallops Lungs: CTAB no crackles, wheeze, rhonchi Abdomen: soft/nontender/nondistended/normal bowel sounds. No rebound or guarding.  Ext: no edema Skin: warm, dry Neuro: grossly normal, moves all extremities, PERRLA   Assessment and Plan   68 y.o. female presenting for annual physical.  Health Maintenance counseling: 1. Anticipatory guidance: Patient counseled regarding regular dental exams - in general q6 months, eye exams - due for her visit,  avoiding smoking and second hand smoke , limiting alcohol to 1 beverage per day- social drinker about 5-8 a week up to 10 , no illicit drugsj.   2. Risk factor reduction:  Advised patient of need for regular exercise and diet rich and fruits and vegetables to reduce risk of heart attack and stroke.  Exercise- will get back into tennis- but doing yard work at the moment .  Diet/weight management-weight down 7 pounds from last visit- feels weight was up due to low exercise then- plus had recent cold and dropped some.  Wt Readings from Last 3 Encounters:  04/06/23 177 lb 12.8 oz (80.6 kg)  03/23/23 180 lb (81.6 kg)  02/02/23 187 lb 1.6 oz (84.9 kg)  3. Immunizations/screenings/ancillary studies-declines flu, Shingrix, COVID.  Prevnar 20- maybe next year as just recovering from illness  Immunization History  Administered Date(s) Administered   PFIZER(Purple Top)SARS-COV-2 Vaccination 04/01/2019, 04/22/2019, 10/16/2019   Td 09/02/2007    Tdap 10/08/2017   Unspecified SARS-COV-2 Vaccination 12/13/2021  4. Cervical cancer screening- past age based screening recommendations but still sees GYN Dr. Henderson Cloud 5. Breast cancer screening-  breast exam with GYN and mammogram 12/15/22 6. Colon cancer screening - December 2023 colonoscopy with 3-year  repeat with Dr. Rhea Belton 7. Skin cancer screening- has visit in next month- [facility]. advised regular sunscreen use. Denies worrisome, changing, or new skin lesions.  8. Birth control/STD check- monogamous and postmenopausal 9. Osteoporosis screening at 65- 06/23/22 with excellent bone density 10. Smoking associated screening -never smoker  Status of chronic or acute concerns   # Prior shoulder surgery with Dr. Robin Searing doing well   # Hand arthritis-has also seen Dr. Merlyn Lot with [facility]- recent cortisone injections somewhat helpful but also had week off of work due to illness- just recovering from illness   #Balance issues- feels like she trips more than she used to. A lot of roots in the yard and can trip or trips going upstairs. Thinks related to prior back injury 2017 and loss of strength in lower legs at that time. Not motivated to do something outside of playing something- bored at the gym.  - she would like to consider physical therapy at [facility] but not at the moment - will reach out when she's ready -if not improving we offered to do further workup  #ductal carcinoma- off biopsy 01/10/22 status postlumpectomy with adjuvant radiation and adjuvant antiestrogen therapy- on anastrazole   #hypertension S: medication: hydrochlorothiazide 25 mg- swelling better on this - takes potasisum intermittently - prior edema on Amlodipine 2.5 mg and 5 mg A/P: well controlled continue current medications   #hyperlipidemia with coronary artery calcium score greater than 80 at 81st percentile for age #Aortic atherosclerosis S: Medication: Rosuvastatin 10 mg also red yeast rice Lab  Results  Component Value Date   CHOL 192 11/22/2021   HDL 78 11/22/2021   LDLCALC 97 11/22/2021   TRIG 79 11/22/2021   CHOLHDL 2.5 11/22/2021  A/P: update levels today- prefer LDL under 70 if possible but has been slightly above this -hold off on aspirin with bruising  # Neuropathy S:intermittent burningpain in her feet- can trigger hot flashes at times - has become more frequent over time almost nightly at this point. Also has back pain on both sides for years.   A/P: with ongoing issues plus with balance- check B12 today   #appetite management- wellbutrin 300 mg XR has been helpful  for maintaining weigh tin overweight range- doing ok on this and wants to continue   Recommended follow up: Return in about 6 months (around 10/06/2023) for followup or sooner if needed.Schedule b4 you leave. Future Appointments  Date Time Provider Department Center  02/08/2024  9:00 AM Serena Croissant, MD [facility] None  03/29/2024 11:20 AM [facility] ANNUAL WELLNESS VISIT 1 [facility]   Lab/Order associations: fasting   ICD-10-CM   1. Preventative health care  Z00.00     2. Essential hypertension  I10     3. Hyperlipidemia, unspecified hyperlipidemia type  E78.5     4. Aortic atherosclerosis (HCC)  I70.0     5. Screening for diabetes mellitus  Z13.1     6. Overweight  E66.3     7. Neuropathy  G62.9       No orders of the defined types were placed in this encounter.   Return precautions advised.  Tana Conch, MD

## 2023-04-06 NOTE — Patient Instructions (Addendum)
 Please stop by lab before you go If you have mychart- we will send your results within 3 business days of [place] receiving them.  If you do not have mychart- we will call you about results within 5 business days of [place] receiving them.  *please also note that you will see labs on mychart as soon as they post. I will later go in and write notes on them- will say "notes from Dr. Durene Cal"   We have placed a referral for you today to [facility]- please call their # if you do not hear within a week (may be listed below or you may see mychart message within a few days with #).   Recommended follow up: Return in about 6 months (around 10/06/2023) for followup or sooner if needed.Schedule b4 you leave.

## 2023-04-09 ENCOUNTER — Other Ambulatory Visit: Payer: Self-pay | Admitting: Family Medicine

## 2023-04-09 ENCOUNTER — Encounter: Payer: Self-pay | Admitting: Family Medicine

## 2023-04-09 LAB — LIPOPROTEIN A (LPA): Lipoprotein (a): 10 nmol/L (ref <75)

## 2023-04-13 DIAGNOSIS — D225 Melanocytic nevi of trunk: Secondary | ICD-10-CM | POA: Diagnosis not present

## 2023-04-13 DIAGNOSIS — L821 Other seborrheic keratosis: Secondary | ICD-10-CM | POA: Diagnosis not present

## 2023-04-13 DIAGNOSIS — L814 Other melanin hyperpigmentation: Secondary | ICD-10-CM | POA: Diagnosis not present

## 2023-04-16 IMAGING — CT CT CARDIAC CORONARY ARTERY CALCIUM SCORE
3 series · 14 of 20 positions shown, 15 images · non-contrast
Comparison: None.
COMPARISON: None.

Addendum:
EXAM:
OVER-READ INTERPRETATION  CT CHEST

The following report is an over-read performed by radiologist Dr.
Nasrin Beharry [REDACTED] on 07/13/2020. This over-read
does not include interpretation of cardiac or coronary anatomy or
pathology. The calcium score interpretation by the cardiologist is
attached.
CLINICAL DATA: Cardiovascular Disease Risk stratification
Coronary Calcium Score
TECHNIQUE: A gated, non-contrast computed tomography scan of the heart was
performed using 3mm slice thickness. Axial images were analyzed on a
dedicated workstation. Calcium scoring of the coronary arteries was
performed using the Agatston method.

[Series 2: casc 3.0 bv41 2 bestdiast 70 % · axial · 0.44mm/px · z∈[+1292,+1364]mm · 4 of 42 slices shown, 5 images]
[im 9/42  vessel]
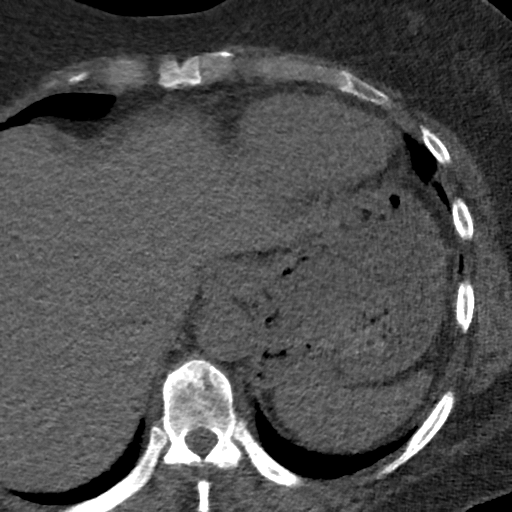
[im 9/42  lung]
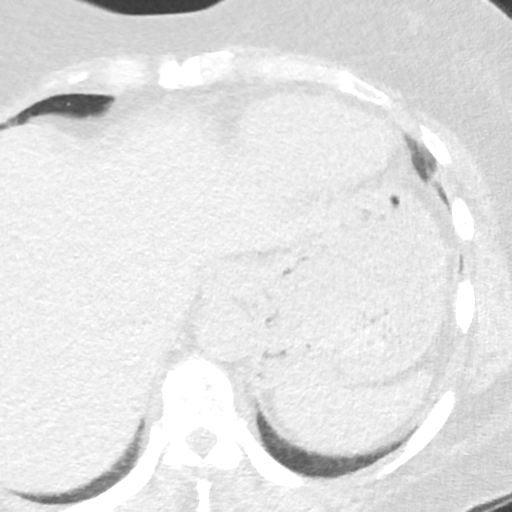
[im 17/42  vessel]
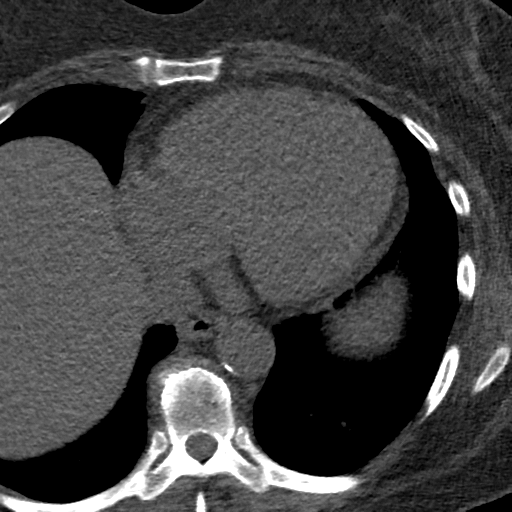
[im 25/42  vessel]
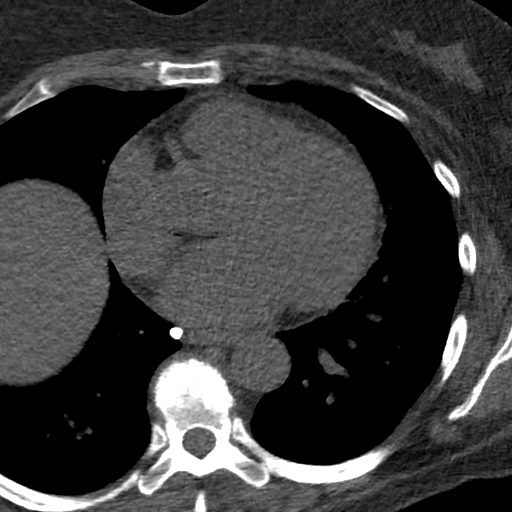
[im 33/42  vessel]
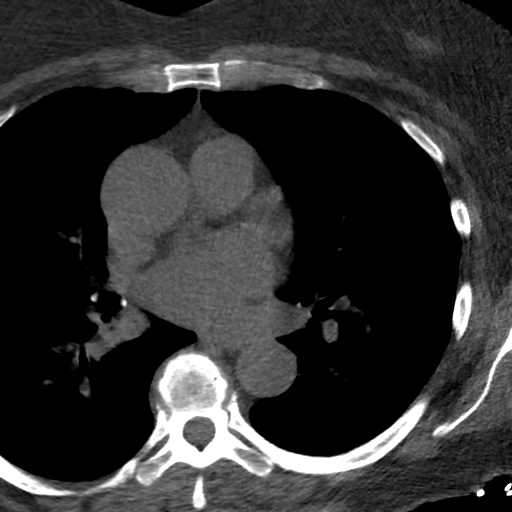

[Series 3: lung 70 % · axial · 0.62mm/px · z∈[+1286,+1370]mm · 5 of 42 slices shown]
[im 7/42  lung]
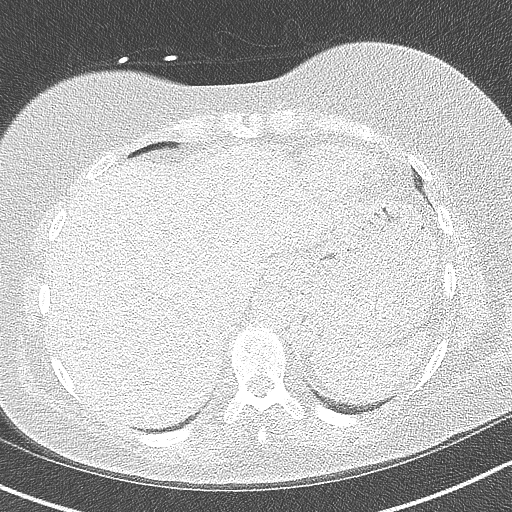
[im 14/42  lung]
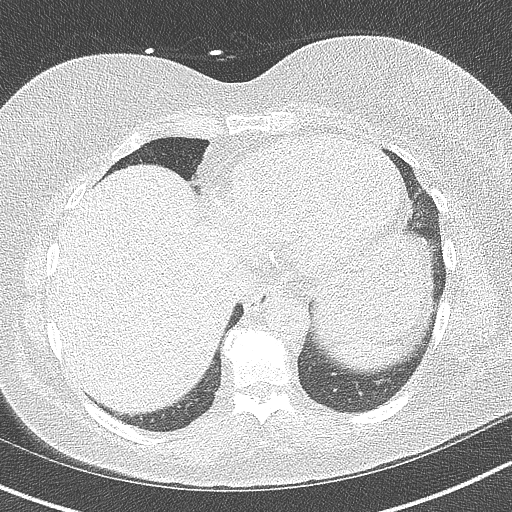
[im 21/42  lung]
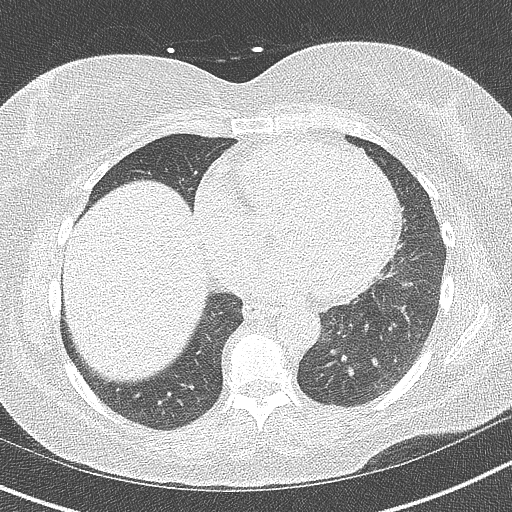
[im 28/42  lung]
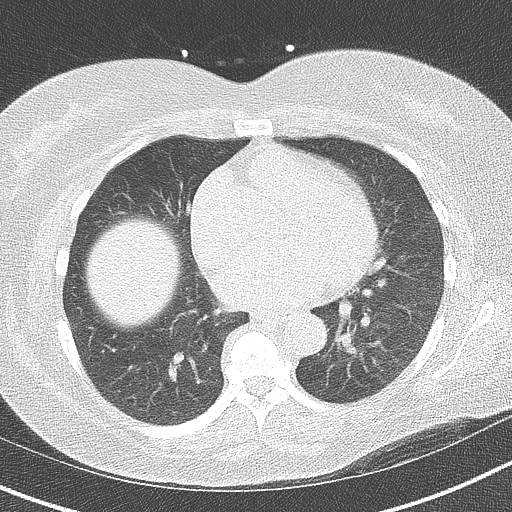
[im 35/42  lung]
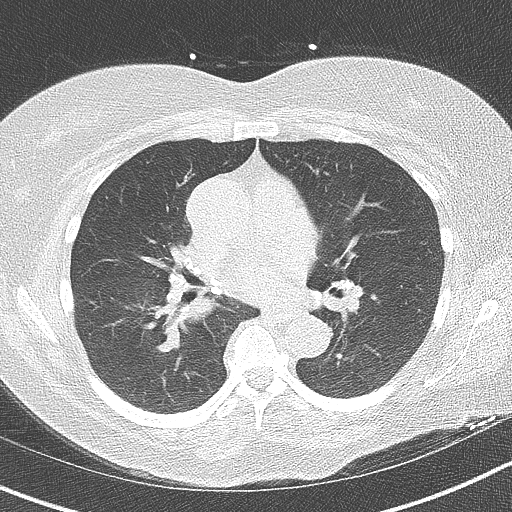

[Series 4: lung st 70 % · axial · 0.62mm/px · z∈[+1286,+1370]mm · 5 of 42 slices shown]
[im 7/42  lung]
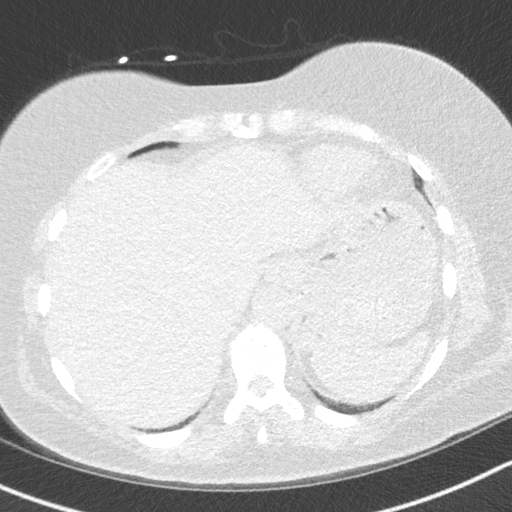
[im 14/42  lung]
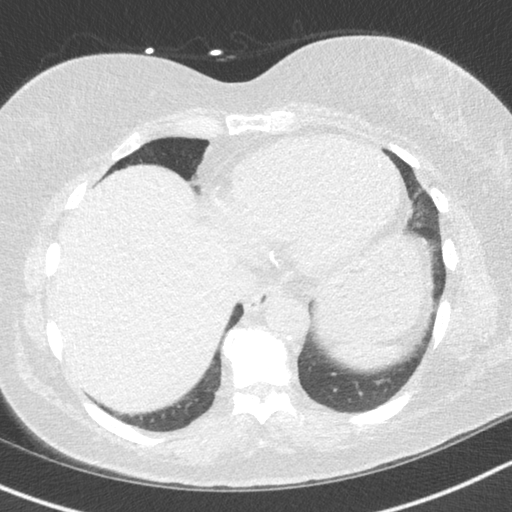
[im 21/42  lung]
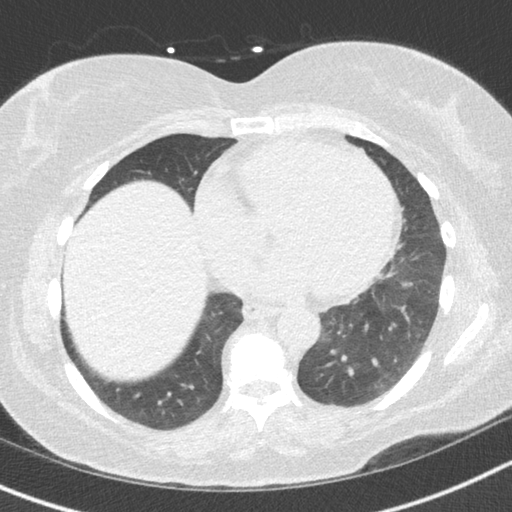
[im 28/42  lung]
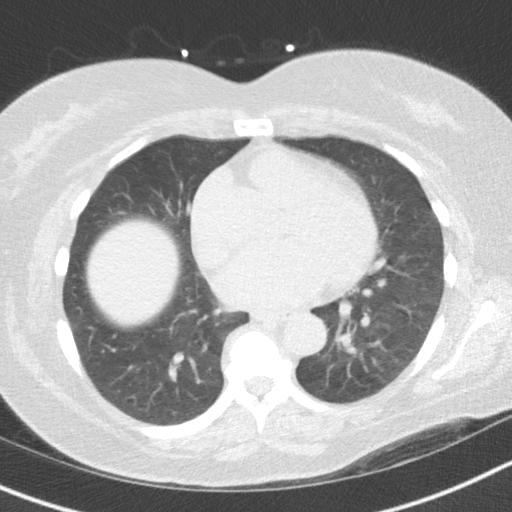
[im 35/42  lung]
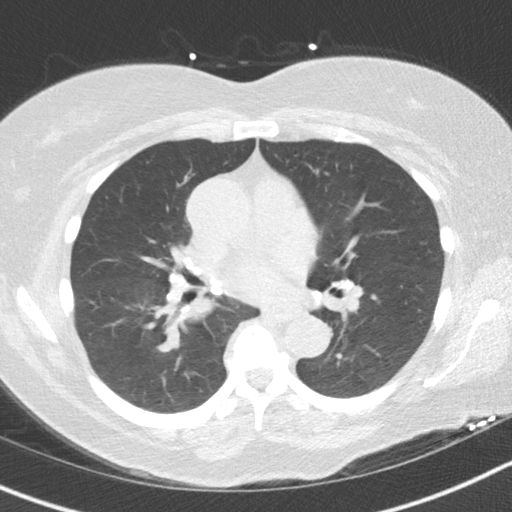

[14 of 20 positions shown; findings below may reference images not displayed]

FINDINGS: Vascular: Aortic atherosclerosis.

Mediastinum/Nodes: Calcified mediastinal and bilateral hilar nodes
are likely related to old granulomatous disease.

Lungs/Pleura: No pleural fluid. Bilateral lower lobe calcified
granulomas.

Upper Abdomen: Normal imaged portions of the liver, spleen, stomach.

Musculoskeletal: Advanced thoracic spondylosis. Moderate right
hemidiaphragm elevation.
IMPRESSION: 1.  No acute findings in the imaged extracardiac chest.
2.  Aortic Atherosclerosis (MPKQL-Y6J.J).
3. Old granulomatous disease.
FINDINGS: Coronary arteries: Normal origins.

Coronary Calcium Score:

Left main: 0

Left anterior descending artery:

Left circumflex artery:

Right coronary artery: 0

Total:

Percentile: 81st

Pericardium: Normal.

Ascending Aorta: Normal caliber.

Non-cardiac: See separate report from [REDACTED].
IMPRESSION: Coronary calcium score of 88.5. This was 81st percentile for age-,
race-, and sex-matched controls.



If CAC=0, it is reasonable to withhold statin therapy and reassess
in 5 to 10 years, as long as higher risk conditions are absent
(diabetes mellitus, family history of premature CHD in first degree
relatives (males <55 years; females <65 years), cigarette smoking,
or LDL >=190 mg/dL).

If CAC is 1 to 99, it is reasonable to initiate statin therapy for
patients >=55 years of age.

If CAC is >=100 or >=75th percentile, it is reasonable to initiate
statin therapy at any age.

Cardiology referral should be considered for patients with CAC
scores >=400 or >=75th percentile.

*4276 AHA/ACC/AACVPR/AAPA/ABC/LORRAINE/LI/BARETA/Jim/EVERARDO/M HAMZA/KRASAVECA
Guideline on the Management of Blood Cholesterol: A Report of the
American College of Cardiology/American Heart Association Task Force
on Clinical Practice Guidelines. J Am Coll Cardiol.
6833;73(24):5256-5680.

*** End of Addendum ***
EXAM:
OVER-READ INTERPRETATION  CT CHEST

The following report is an over-read performed by radiologist Dr.
Nasrin Beharry [REDACTED] on 07/13/2020. This over-read
does not include interpretation of cardiac or coronary anatomy or
pathology. The calcium score interpretation by the cardiologist is
attached.
FINDINGS: Vascular: Aortic atherosclerosis.

Mediastinum/Nodes: Calcified mediastinal and bilateral hilar nodes
are likely related to old granulomatous disease.

Lungs/Pleura: No pleural fluid. Bilateral lower lobe calcified
granulomas.

Upper Abdomen: Normal imaged portions of the liver, spleen, stomach.

Musculoskeletal: Advanced thoracic spondylosis. Moderate right
hemidiaphragm elevation.
IMPRESSION: 1.  No acute findings in the imaged extracardiac chest.
2.  Aortic Atherosclerosis (MPKQL-Y6J.J).
3. Old granulomatous disease.

## 2023-04-26 ENCOUNTER — Other Ambulatory Visit: Payer: Self-pay | Admitting: Hematology and Oncology

## 2023-04-27 DIAGNOSIS — R262 Difficulty in walking, not elsewhere classified: Secondary | ICD-10-CM | POA: Diagnosis not present

## 2023-04-27 DIAGNOSIS — M6281 Muscle weakness (generalized): Secondary | ICD-10-CM | POA: Diagnosis not present

## 2023-04-27 DIAGNOSIS — R2689 Other abnormalities of gait and mobility: Secondary | ICD-10-CM | POA: Diagnosis not present

## 2023-05-11 DIAGNOSIS — R2689 Other abnormalities of gait and mobility: Secondary | ICD-10-CM | POA: Diagnosis not present

## 2023-05-11 DIAGNOSIS — R262 Difficulty in walking, not elsewhere classified: Secondary | ICD-10-CM | POA: Diagnosis not present

## 2023-05-11 DIAGNOSIS — M6281 Muscle weakness (generalized): Secondary | ICD-10-CM | POA: Diagnosis not present

## 2023-05-15 ENCOUNTER — Ambulatory Visit (HOSPITAL_COMMUNITY)
Admission: RE | Admit: 2023-05-15 | Discharge: 2023-05-15 | Disposition: A | Discharge status: Home / Self Care | Source: Ambulatory Visit | Attending: Adult Health | Admitting: Adult Health

## 2023-05-15 DIAGNOSIS — R911 Solitary pulmonary nodule: Secondary | ICD-10-CM | POA: Diagnosis not present

## 2023-05-15 DIAGNOSIS — C50412 Malignant neoplasm of upper-outer quadrant of left female breast: Secondary | ICD-10-CM | POA: Diagnosis not present

## 2023-05-15 DIAGNOSIS — Z170 Estrogen receptor positive status [ER+]: Secondary | ICD-10-CM | POA: Diagnosis not present

## 2023-05-15 DIAGNOSIS — I700 Atherosclerosis of aorta: Secondary | ICD-10-CM | POA: Diagnosis not present

## 2023-05-15 MED ORDER — IOHEXOL 300 MG/ML  SOLN
75.0000 mL | Freq: Once | INTRAMUSCULAR | Status: AC | PRN
  Administered 2023-05-15: 75 mL via INTRAVENOUS

## 2023-05-18 DIAGNOSIS — R262 Difficulty in walking, not elsewhere classified: Secondary | ICD-10-CM | POA: Diagnosis not present

## 2023-05-18 DIAGNOSIS — R2689 Other abnormalities of gait and mobility: Secondary | ICD-10-CM | POA: Diagnosis not present

## 2023-05-18 DIAGNOSIS — M6281 Muscle weakness (generalized): Secondary | ICD-10-CM | POA: Diagnosis not present

## 2023-05-25 DIAGNOSIS — S63642D Sprain of metacarpophalangeal joint of left thumb, subsequent encounter: Secondary | ICD-10-CM | POA: Diagnosis not present

## 2023-05-25 DIAGNOSIS — M65311 Trigger thumb, right thumb: Secondary | ICD-10-CM | POA: Diagnosis not present

## 2023-05-26 ENCOUNTER — Ambulatory Visit: Payer: Self-pay | Admitting: *Deleted

## 2023-06-08 DIAGNOSIS — M6281 Muscle weakness (generalized): Secondary | ICD-10-CM | POA: Diagnosis not present

## 2023-06-08 DIAGNOSIS — R262 Difficulty in walking, not elsewhere classified: Secondary | ICD-10-CM | POA: Diagnosis not present

## 2023-06-08 DIAGNOSIS — R2689 Other abnormalities of gait and mobility: Secondary | ICD-10-CM | POA: Diagnosis not present

## 2023-06-18 DIAGNOSIS — M6281 Muscle weakness (generalized): Secondary | ICD-10-CM | POA: Diagnosis not present

## 2023-06-18 DIAGNOSIS — R2689 Other abnormalities of gait and mobility: Secondary | ICD-10-CM | POA: Diagnosis not present

## 2023-06-18 DIAGNOSIS — R262 Difficulty in walking, not elsewhere classified: Secondary | ICD-10-CM | POA: Diagnosis not present

## 2023-06-22 DIAGNOSIS — R2689 Other abnormalities of gait and mobility: Secondary | ICD-10-CM | POA: Diagnosis not present

## 2023-06-22 DIAGNOSIS — R262 Difficulty in walking, not elsewhere classified: Secondary | ICD-10-CM | POA: Diagnosis not present

## 2023-06-22 DIAGNOSIS — M6281 Muscle weakness (generalized): Secondary | ICD-10-CM | POA: Diagnosis not present

## 2023-06-29 DIAGNOSIS — R2689 Other abnormalities of gait and mobility: Secondary | ICD-10-CM | POA: Diagnosis not present

## 2023-06-29 DIAGNOSIS — M6281 Muscle weakness (generalized): Secondary | ICD-10-CM | POA: Diagnosis not present

## 2023-06-29 DIAGNOSIS — R262 Difficulty in walking, not elsewhere classified: Secondary | ICD-10-CM | POA: Diagnosis not present

## 2023-07-04 ENCOUNTER — Other Ambulatory Visit: Payer: Self-pay | Admitting: Family Medicine

## 2023-07-06 DIAGNOSIS — R262 Difficulty in walking, not elsewhere classified: Secondary | ICD-10-CM | POA: Diagnosis not present

## 2023-07-06 DIAGNOSIS — M6281 Muscle weakness (generalized): Secondary | ICD-10-CM | POA: Diagnosis not present

## 2023-07-06 DIAGNOSIS — R2689 Other abnormalities of gait and mobility: Secondary | ICD-10-CM | POA: Diagnosis not present

## 2023-07-13 DIAGNOSIS — R262 Difficulty in walking, not elsewhere classified: Secondary | ICD-10-CM | POA: Diagnosis not present

## 2023-07-13 DIAGNOSIS — R2689 Other abnormalities of gait and mobility: Secondary | ICD-10-CM | POA: Diagnosis not present

## 2023-07-13 DIAGNOSIS — M6281 Muscle weakness (generalized): Secondary | ICD-10-CM | POA: Diagnosis not present

## 2023-07-20 DIAGNOSIS — R2689 Other abnormalities of gait and mobility: Secondary | ICD-10-CM | POA: Diagnosis not present

## 2023-07-20 DIAGNOSIS — M6281 Muscle weakness (generalized): Secondary | ICD-10-CM | POA: Diagnosis not present

## 2023-07-20 DIAGNOSIS — R262 Difficulty in walking, not elsewhere classified: Secondary | ICD-10-CM | POA: Diagnosis not present

## 2023-07-27 DIAGNOSIS — R2689 Other abnormalities of gait and mobility: Secondary | ICD-10-CM | POA: Diagnosis not present

## 2023-07-27 DIAGNOSIS — M6281 Muscle weakness (generalized): Secondary | ICD-10-CM | POA: Diagnosis not present

## 2023-07-27 DIAGNOSIS — R262 Difficulty in walking, not elsewhere classified: Secondary | ICD-10-CM | POA: Diagnosis not present

## 2023-08-10 DIAGNOSIS — S63642D Sprain of metacarpophalangeal joint of left thumb, subsequent encounter: Secondary | ICD-10-CM | POA: Diagnosis not present

## 2023-08-10 DIAGNOSIS — M65311 Trigger thumb, right thumb: Secondary | ICD-10-CM | POA: Diagnosis not present

## 2023-09-11 ENCOUNTER — Other Ambulatory Visit: Payer: Self-pay | Admitting: Family Medicine

## 2023-10-08 ENCOUNTER — Other Ambulatory Visit: Payer: Self-pay | Admitting: Family Medicine

## 2023-10-12 ENCOUNTER — Ambulatory Visit: Admitting: Family Medicine

## 2023-10-29 ENCOUNTER — Ambulatory Visit: Admitting: Family Medicine

## 2023-10-29 ENCOUNTER — Encounter: Payer: Self-pay | Admitting: Family Medicine

## 2023-10-29 VITALS — BP 138/84 | HR 90 | Temp 98.1°F | Ht 66.0 in | Wt 181.8 lb

## 2023-10-29 DIAGNOSIS — C50412 Malignant neoplasm of upper-outer quadrant of left female breast: Secondary | ICD-10-CM | POA: Diagnosis not present

## 2023-10-29 DIAGNOSIS — I10 Essential (primary) hypertension: Secondary | ICD-10-CM | POA: Diagnosis not present

## 2023-10-29 DIAGNOSIS — Z131 Encounter for screening for diabetes mellitus: Secondary | ICD-10-CM | POA: Diagnosis not present

## 2023-10-29 DIAGNOSIS — Z170 Estrogen receptor positive status [ER+]: Secondary | ICD-10-CM

## 2023-10-29 DIAGNOSIS — E785 Hyperlipidemia, unspecified: Secondary | ICD-10-CM

## 2023-10-29 DIAGNOSIS — E663 Overweight: Secondary | ICD-10-CM

## 2023-10-29 MED ORDER — HYDROCHLOROTHIAZIDE 12.5 MG PO TABS: 12.5000 mg | ORAL_TABLET | Freq: Every day | ORAL | 3 refills | Status: AC

## 2023-10-29 NOTE — Patient Instructions (Addendum)
 Please stop by lab before you go If you have mychart- we will send your results within 3 business days of us  receiving them.  If you do not have mychart- we will call you about results within 5 business days of us  receiving them.  *please also note that you will see labs on mychart as soon as they post. I will later go in and write notes on them- will say notes from Dr. Katrinka Atlas try to get exercise going- prefer blood pressure 130 or less if possible but will tolerate under 140  Recommended follow up: Return for next already scheduled visit or sooner if needed.

## 2023-10-29 NOTE — Progress Notes (Signed)
 Phone [phone] In person visit   Subjective:   Michelle Howell is a 68 y.o. year old very pleasant female patient who presents for/with See problem oriented charting Chief Complaint  Patient presents with   Hypertension    Pt has questions on why diastolic bp readings have been higher than normal;    Knee Pain    Fell 3 weeks ago and has been having left knee pain;    Past Medical History-  Patient Active Problem List   Diagnosis Date Noted   Malignant neoplasm of upper-outer quadrant of left breast in female, estrogen receptor positive (HCC) 01/21/2022    Priority: High   Aortic atherosclerosis 07/13/2020    Priority: Medium    Essential hypertension 11/24/2017    Priority: Medium    Hyperlipidemia 08/12/2013    Priority: Medium    Genetic testing 02/03/2022    Priority: Low   GERD (gastroesophageal reflux disease) 10/10/2017    Priority: Low   Arthritis of left ankle 08/12/2013    Priority: Low   Gamekeeper's thumb, left 03/06/2022    Medications- reviewed and updated Current Outpatient Medications  Medication Sig Dispense Refill   anastrozole  (ARIMIDEX ) 1 MG tablet TAKE 1 TABLET BY MOUTH DAILY 90 tablet 3   Ascorbic Acid (VITAMIN C) 1000 MG tablet Take 1,000 mg by mouth daily.     buPROPion  (WELLBUTRIN  XL) 300 MG 24 hr tablet TAKE 1 TABLET BY MOUTH DAILY 90 tablet 0   Cholecalciferol (VITAMIN D) 2000 UNITS tablet Take 2,000 Units by mouth daily.     Coenzyme Q10 (CO Q 10 PO) Take by mouth daily.     Ginseng 100 MG CAPS Take 1 capsule by mouth daily.     hydrochlorothiazide  (HYDRODIURIL ) 12.5 MG tablet Take 1 tablet (12.5 mg total) by mouth daily. 90 tablet 3   MAGNESIUM PO Take by mouth.     Misc Natural Products (YUMVS BEET ROOT-TART CHERRY PO) Take by mouth.     Omega 3-6-9 Fatty Acids (OMEGA-3-6-9 PO) Take by mouth daily.     PINE BARK, PYCNOGENOL, PO Take by mouth daily.     potassium chloride (MICRO-K) 10 MEQ CR capsule Take 10 mEq by mouth daily.      Red Yeast Rice Extract (RED YEAST RICE PO) Take by mouth daily.     rosuvastatin  (CRESTOR ) 10 MG tablet TAKE 1 TABLET BY MOUTH DAILY 90 tablet 3   No current facility-administered medications for this visit.     Objective:  BP 138/84 Comment: most recent home reading  Pulse 90   Temp 98.1 F (36.7 C) (Temporal)   Ht 5' 6 (1.676 m)   Wt 181 lb 12.8 oz (82.5 kg)   LMP  (LMP Unknown)   SpO2 96%   BMI 29.34 kg/m  Gen: NAD, resting comfortably CV: RRR no murmurs rubs or gallops Lungs: CTAB no crackles, wheeze, rhonchi Ext: trace edema, mild tenderness medial portion of left knee Skin: warm, dry     Assessment and Plan   # Left knee pain S: Had fall about 3 weeks ago and has been having left knee pain since that time. Was walking dog in dark yard- she slipped and dog pulled and down she went forward- not sure if hit knee directly.  A/P: feels like soft tissue injury that's improving- she iced and is now taping and making progress- continue to monitor   0 needs to be cautious  #hypertension S: medication: hydrochlorothiazide  25 mg-after last visit recommended half  dose due to low sodium and to repeat she did not have a chance to come back for that-sodium as low as 129. She is on the half dose.  - prior edema on Amlodipine  2.5 mg and 5 mg Home readings #s: usually 130s at home-at orthopedic diastolic was 93 . Has needed more ibuprofen lately which can be running this up some -not able to play tennis lately. Mild weight increase can contribute to weight- wants to exercise more. Plus more travel related eating BP Readings from Last 3 Encounters:  10/29/23 138/84  04/06/23 130/80  03/23/23 (!) 141/88    A/P: blood pressure high acceptable on most recent home reading- wants to reengage with exercise but continue the lower hydrochlorothiazide  at 12.5 mg- we sent his in- if trending up at home she will let us  know  #hyperlipidemia with coronary artery calcium  score greater than 80 at  81st percentile for age #Aortic atherosclerosis S: Medication: Rosuvastatin  10 mg plus red yeast rice Lab Results  Component Value Date   CHOL 151 04/06/2023   HDL 60.80 04/06/2023   LDLCALC 63 04/06/2023   TRIG 137.0 04/06/2023   CHOLHDL 2 04/06/2023  A/P: well controlled continue current medications   #remains on the arimidex  with history ductal carcinoma- does well with this. Scheduled for mammogram in december  #appetite management- wellbutrin  300 mg XR has been helpful  for maintaining weigh tin overweight range- continue current medications    Recommended follow up: Return for next already scheduled visit or sooner if needed. Future Appointments  Date Time Provider Department Center  02/08/2024  9:00 AM Odean Potts, MD [facility] None  03/29/2024 11:20 AM [facility] ANNUAL WELLNESS VISIT 1 [facility] Michelle Howell  04/11/2024  9:00 AM Michelle Howell, Michelle KIDD, MD [facility]    Lab/Order associations:   ICD-10-CM   1. Essential hypertension  I10     2. Hyperlipidemia, unspecified hyperlipidemia type  E78.5     3. Malignant neoplasm of upper-outer quadrant of left breast in female, estrogen receptor positive (HCC)  C50.412    Z17.0       Meds ordered this encounter  Medications   hydrochlorothiazide  (HYDRODIURIL ) 12.5 MG tablet    Sig: Take 1 tablet (12.5 mg total) by mouth daily.    Dispense:  90 tablet    Refill:  3    Return precautions advised.  Michelle Katrinka, MD

## 2023-10-30 ENCOUNTER — Ambulatory Visit: Payer: Self-pay | Admitting: Family Medicine

## 2023-10-30 LAB — COMPREHENSIVE METABOLIC PANEL WITH GFR
ALT: 32 U/L (ref 0–35)
AST: 25 U/L (ref 0–37)
Albumin: 4.7 g/dL (ref 3.5–5.2)
Alkaline Phosphatase: 82 U/L (ref 39–117)
BUN: 15 mg/dL (ref 6–23)
CO2: 24 meq/L (ref 19–32)
Calcium: 9.7 mg/dL (ref 8.4–10.5)
Chloride: 102 meq/L (ref 96–112)
Creatinine, Ser: 0.72 mg/dL (ref 0.40–1.20)
GFR: 86.04 mL/min (ref >60.00)
Glucose, Bld: 159 mg/dL — ABNORMAL HIGH (ref 70–99)
Potassium: 3.6 meq/L (ref 3.5–5.1)
Sodium: 137 meq/L (ref 135–145)
Total Bilirubin: 0.6 mg/dL (ref 0.2–1.2)
Total Protein: 7.2 g/dL (ref 6.0–8.3)

## 2023-10-30 LAB — HEMOGLOBIN A1C: Hgb A1c MFr Bld: 5.6 % (ref 4.6–6.5)

## 2023-11-24 ENCOUNTER — Other Ambulatory Visit: Payer: Self-pay | Admitting: Family Medicine

## 2023-11-24 DIAGNOSIS — Z1231 Encounter for screening mammogram for malignant neoplasm of breast: Secondary | ICD-10-CM

## 2023-12-10 ENCOUNTER — Other Ambulatory Visit: Payer: Self-pay | Admitting: Family Medicine

## 2023-12-10 DIAGNOSIS — E785 Hyperlipidemia, unspecified: Secondary | ICD-10-CM

## 2023-12-24 ENCOUNTER — Inpatient Hospital Stay: Admission: RE | Admit: 2023-12-24 | Discharge: 2023-12-24 | Attending: Family Medicine | Admitting: Family Medicine

## 2023-12-24 ENCOUNTER — Other Ambulatory Visit: Payer: Self-pay | Admitting: Family Medicine

## 2023-12-24 DIAGNOSIS — Z1231 Encounter for screening mammogram for malignant neoplasm of breast: Secondary | ICD-10-CM

## 2024-01-15 ENCOUNTER — Other Ambulatory Visit: Payer: Self-pay | Admitting: Family Medicine

## 2024-01-19 ENCOUNTER — Telehealth: Payer: Self-pay | Admitting: Family Medicine

## 2024-01-19 ENCOUNTER — Other Ambulatory Visit: Payer: Self-pay | Admitting: Family Medicine

## 2024-01-19 DIAGNOSIS — M25562 Pain in left knee: Secondary | ICD-10-CM

## 2024-01-19 NOTE — Telephone Encounter (Signed)
 Referral sent for left knee pain per last office visit notes.   Copied from CRM [id number]. Topic: Referral - Request for Referral >> Jan 19, 2024  1:12 PM Mesmerise C wrote: Did the patient discuss referral with their provider in the last year? Yes (If No - schedule appointment) (If Yes - send message)  Appointment offered? No  Type of order/referral and detailed reason for visit: Orthopedics  Preference of office, provider, location: Dr.[facility] [address] [facility] [address] [phone]  If referral order, have you been seen by this specialty before? No (If Yes, this issue or another issue? When? Where?  Can we respond through MyChart? Yes

## 2024-02-05 ENCOUNTER — Telehealth: Payer: Self-pay | Admitting: Hematology and Oncology

## 2024-02-05 NOTE — Telephone Encounter (Signed)
 I left voicemail for patient regarding rescheduled MD appointment from 02/08/2024 to 02/09/2024 due to [facility] opening at 10:00 AM on 02/08/2024.

## 2024-02-08 ENCOUNTER — Inpatient Hospital Stay: Payer: PPO | Admitting: Hematology and Oncology

## 2024-02-09 ENCOUNTER — Inpatient Hospital Stay: Admitting: Hematology and Oncology

## 2024-03-14 ENCOUNTER — Inpatient Hospital Stay: Admitting: Hematology and Oncology

## 2024-03-29 ENCOUNTER — Encounter

## 2024-04-11 ENCOUNTER — Encounter: Admitting: Family Medicine
